# Patient Record
Sex: Male | Born: 1950 | Race: White | Hispanic: No | Smoking: Never smoker
Health system: Southern US, Community
[De-identification: ages and names within clinical notes are randomized; demographics above are authoritative.]

## PROBLEM LIST (undated history)

## (undated) DIAGNOSIS — E559 Vitamin D deficiency, unspecified: Secondary | ICD-10-CM

## (undated) DIAGNOSIS — F32A Depression, unspecified: Secondary | ICD-10-CM

## (undated) DIAGNOSIS — F419 Anxiety disorder, unspecified: Secondary | ICD-10-CM

## (undated) DIAGNOSIS — F329 Major depressive disorder, single episode, unspecified: Secondary | ICD-10-CM

## (undated) DIAGNOSIS — E785 Hyperlipidemia, unspecified: Secondary | ICD-10-CM

## (undated) DIAGNOSIS — F909 Attention-deficit hyperactivity disorder, unspecified type: Secondary | ICD-10-CM

## (undated) HISTORY — DX: Attention-deficit hyperactivity disorder, unspecified type: F90.9

## (undated) HISTORY — DX: Anxiety disorder, unspecified: F41.9

## (undated) HISTORY — DX: Depression, unspecified: F32.A

## (undated) HISTORY — DX: Vitamin D deficiency, unspecified: E55.9

## (undated) HISTORY — DX: Hyperlipidemia, unspecified: E78.5

---
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
## 2002-07-13 ENCOUNTER — Ambulatory Visit (HOSPITAL_COMMUNITY): Admission: RE | Admit: 2002-07-13 | Discharge: 2002-07-13 | Payer: Self-pay | Admitting: Gastroenterology

## 2002-07-13 ENCOUNTER — Encounter (INDEPENDENT_AMBULATORY_CARE_PROVIDER_SITE_OTHER): Payer: Self-pay | Admitting: Specialist

## 2005-01-08 ENCOUNTER — Ambulatory Visit: Payer: Self-pay | Admitting: Cardiology

## 2005-02-19 ENCOUNTER — Encounter: Payer: Self-pay | Admitting: Cardiology

## 2005-02-19 ENCOUNTER — Ambulatory Visit: Payer: Self-pay | Admitting: Cardiology

## 2005-02-19 ENCOUNTER — Ambulatory Visit (HOSPITAL_COMMUNITY): Admission: RE | Admit: 2005-02-19 | Discharge: 2005-02-19 | Payer: Self-pay | Admitting: Cardiology

## 2005-04-09 ENCOUNTER — Ambulatory Visit: Payer: Self-pay

## 2005-07-18 HISTORY — PX: MITRAL VALVE REPAIR: SHX2039

## 2005-08-05 ENCOUNTER — Ambulatory Visit: Payer: Self-pay

## 2005-08-05 ENCOUNTER — Encounter: Payer: Self-pay | Admitting: Internal Medicine

## 2005-08-05 ENCOUNTER — Ambulatory Visit: Payer: Self-pay | Admitting: Cardiology

## 2006-10-14 ENCOUNTER — Ambulatory Visit: Payer: Self-pay | Admitting: Cardiology

## 2006-10-14 ENCOUNTER — Ambulatory Visit: Payer: Self-pay

## 2006-10-14 ENCOUNTER — Encounter: Payer: Self-pay | Admitting: Cardiology

## 2006-11-08 ENCOUNTER — Ambulatory Visit: Payer: Self-pay | Admitting: Internal Medicine

## 2006-11-08 ENCOUNTER — Ambulatory Visit (HOSPITAL_COMMUNITY): Admission: RE | Admit: 2006-11-08 | Discharge: 2006-11-08 | Payer: Self-pay | Admitting: Cardiology

## 2008-10-14 ENCOUNTER — Ambulatory Visit: Payer: Self-pay | Admitting: Internal Medicine

## 2008-10-14 ENCOUNTER — Encounter: Payer: Self-pay | Admitting: Internal Medicine

## 2008-10-16 ENCOUNTER — Ambulatory Visit: Payer: Self-pay | Admitting: Cardiology

## 2008-10-16 ENCOUNTER — Inpatient Hospital Stay (HOSPITAL_BASED_OUTPATIENT_CLINIC_OR_DEPARTMENT_OTHER): Admission: RE | Admit: 2008-10-16 | Discharge: 2008-10-16 | Payer: Self-pay | Admitting: Cardiology

## 2009-07-16 ENCOUNTER — Ambulatory Visit: Payer: Self-pay | Admitting: Sports Medicine

## 2009-07-16 DIAGNOSIS — M19049 Primary osteoarthritis, unspecified hand: Secondary | ICD-10-CM | POA: Insufficient documentation

## 2009-07-16 DIAGNOSIS — M25539 Pain in unspecified wrist: Secondary | ICD-10-CM | POA: Insufficient documentation

## 2010-12-15 LAB — POCT I-STAT 3, VENOUS BLOOD GAS (G3P V)
Acid-base deficit: 4 mmol/L — ABNORMAL HIGH (ref 0.0–2.0)
Bicarbonate: 22.6 mEq/L (ref 20.0–24.0)
pO2, Ven: 40 mmHg (ref 30.0–45.0)

## 2010-12-15 LAB — POCT I-STAT 3, ART BLOOD GAS (G3+)
O2 Saturation: 95 %
TCO2: 23 mmol/L (ref 0–100)
pCO2 arterial: 43.2 mmHg (ref 35.0–45.0)

## 2011-01-12 NOTE — Cardiovascular Report (Signed)
Justin Marshall, Justin Marshall                ACCOUNT NO.:  [id number]   MEDICAL RECORD NO.:  [id number]          PATIENT TYPE:  OIB   LOCATION:  1961                         FACILITY:  [facility]   PHYSICIAN:  Justin R. Juanda Chance, MD, FACCDATE OF BIRTH:  03/19/1951   DATE OF PROCEDURE:  10/16/2008  DATE OF DISCHARGE:                            CARDIAC CATHETERIZATION   CLINICAL HISTORY:  Justin Marshall is 60 year old.  In 2006, Justin Marshall had mitral  valve repair for severe mitral regurgitation by Dr. Diona Fanti at [facility].  Justin Marshall has done well since that time, although Justin Marshall says Justin Marshall has  never really gotten his exercise tolerance back.  More recently, Justin Marshall has  had increased shortness of breath with exertion.  Justin Marshall saw Dr. Dietrich Pates  in the office and she did an echocardiogram which indicated that the  valve function was good without any significant regurgitation and with  good left ventricular function.  She recommended further evaluation and  angiography for evaluation of his shortness of breath.   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  Justin Marshall has 4 daughters, but they  are all away from home, either in school or working.  Justin Marshall has been  separated from his wife since 2006.   PROCEDURE:  The procedure was performed via the right femoral artery and  arterial sheath and 4-French preformed coronary catheters.  A front wall  arterial puncture was performed and Omnipaque contrast was used.  Right  heart catheterization was performed percutaneously via right femoral  vein using venous sheath and Swan-Ganz thermodilution catheter.  The  patient tolerated the procedure well and left Laboratory in satisfactory  condition.   RESULTS:  Left main coronary artery.  The left main coronary artery was  free of significant disease.   Left anterior descending artery.  The left anterior  descending artery  gave rise 2 septal perforators and 3 diagonal branches.  There was  minimal narrowing in the proximal LAD of 20%.   Circumflex artery.  The circumflex artery gave rise to a large ramus  branch,  a large marginal branch, and 2 small posterolateral branches.  These vessels were free of significant disease.   Right coronary artery.  The right coronary artery was a moderate-sized  vessel that gave rise to a conus branch, 2 right ventricular branches, a  posterior descending branch, a posterolateral branch.  This vessel was  free of significant disease.   Left ventriculogram.  The left ventriculogram performed on the RAO  projection showed good wall motion with no areas of hypokinesis.  There  was no mitral regurgitation.  Estimated ejection fraction was 50-55%.   HEMODYNAMIC DATA:  The right atrial pressure was 5 mean.  The pulmonary  artery pressure was 19/8 with a mean of 13.  The pulmonary wedge  pressure was 10 mean.  Left ventricular pressure was 113/17.  The aortic  pressure was 113/69 with mean of 90.  Cardiac output/cardiac index  was  4.7/2.6 meters/minutes/meter squared by Fick.   CONCLUSION:  1. Minimal nonobstructive coronary artery disease with 20% narrowing      in the proximal LAD.  2. Good left ventricular function.  3. No significant mitral regurgitation and no mitral valve gradient.   RECOMMENDATIONS:  The patient has no source of ischemia and his valve  function looks good and his pulmonary artery pressures and filling  pressures are normal.  I think it is not likely his shortness breath is  cardiac in etiology.  Justin Marshall has been under a great deal of stress recently  and I will discuss with him regarding further management and further  help with this.      Justin Elvera Lennox Juanda Chance, MD, [facility]  Electronically Signed     BRB/MEDQ  D:  10/16/2008  T:  10/17/2008  Job:  [id number]   cc:   Arturo Morton. Riley Kill, MD, [facility]  Luis Abed, MD, [facility]  Pricilla Riffle,  MD, [facility]

## 2011-01-12 NOTE — Assessment & Plan Note (Signed)
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   HISTORY OF PRESENT ILLNESS:  The patient has a history of mitral valve  prolapse/mitral regurgitation, is status post mitral valve repair at  [facility] back in 2006.  At that time, he reportedly had a 20% left main  lesion.   The patient was seen last in Cardiology Clinic by Jerral Bonito.  Evaluation  included an echocardiogram that showed normal LV function, normal valve  function, mitral valve function.  He underwent cardiopulmonary stress  testing also at that time for shortness of breath that actually showed  normal tolerance.   The patient called in earlier this morning.  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  Still he notes with increased stress, he still cannot  explain all his shortness of breath.  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  He had attributed it to deconditioning and poor conditioning  in the past, but he is concerned now about the excessive recovery time.  His secretary at work also added that with stairs, he has to sit down to  catch his breath (one flight).   The patient notes rare chest pain.  He attributed this spell he had  recently to excess [facility].  He denies PND.  Appetite is good.  No  lower extremity edema.   ALLERGIES:  Flecainide leading to  ventricular tachycardia.   CURRENT MEDICATIONS:  1. Aspirin 325 one time per week.  2. Adderall 20 mg t.i.d. (prescribed by Dr. Marina Goodell).  3. Lexapro 10 or 20 mg daily.  4. Vitamin D 50,000 international units one time per week.  5. Coenzyme Q10.  6. Multivitamin.   PAST MEDICAL HISTORY:  Mitral valve prolapse status post repair,  palpitations in 1988.   PAST SURGICAL HISTORY:  Status post mitral valve repair.   FAMILY HISTORY:  Mother with a history of cardiomyopathy, normal  coronary arteries.  Two siblings with bipolar disorder.  The patient has  four daughters.   REVIEW OF SYSTEMS:  The patient notes his total cholesterol was up over  200.  He is going to recheck it.  Otherwise, all systems reviewed.  He  does note he is cold all the time.  He did check his thyroid which he  reports is normal.  Also notes his vitamin D level has been low at  14.  Drinks a lot of coffee, [employer].  Again says, his appetite is good.   PHYSICAL EXAMINATION:  GENERAL:  The patient is a 60 year old who looks  mildly disheveled.  Conversation a little scattered.  VITAL SIGNS:  Blood pressure 130/70, pulse is 90s to 108, weight 144  down from 152 in 2008.  HEENT:  Normocephalic, atraumatic.  EOMI.  PERRL.  The patient wears  glasses.  NECK:  JVP is normal.  No thyromegaly.  No bruits audible.  LUNGS:  Clear.  Moving air well.  No wheezes or rales.  CARDIAC:  Regular rate and rhythm.  S1 and S2.  No S3, no murmurs.  PMI  not displaced.  ABDOMEN:  Supple.  Scaphoid.  No masses.  No hepatomegaly.  Normal bowel  sounds.  EXTREMITIES:  Good distal pulses.  No lower extremity edema.   A 12-lead EKG shows normal sinus rhythm to sinus tachycardia 98-108  beats per minute.  There is slight sagging of the ST segments in the  inferior leads II, III, and lateral leads.  V5, V6 are not significantly  changed from previous.   Echocardiogram today shows normal LV and RV systolic function.  Mitral  valve  moves well.  There is trivial MR.  Tricuspid regurg is trivial,  difficult to estimate PA pressure.   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  He comes in  today for evaluation of dyspnea.  I had a hard time with his history  getting a consistent exercise tolerance, but I am convinced he is more  short of breath than he should be for his age.  Whether this is all  because of the stress he is under or if there is a cardiac reason I am  not sure.  I do think it would be important to rule out any progression  of his coronary artery disease before simply labeling it as stress.  Therefore, I have discussed this with Bonnee Quin that the patient  undergo cardiac catheterization to define his anatomy.  We can also  measure pulmonary pressures at that time.  If this is negative, I have  spent time talking to the patient he needs to modify his working  condition to allow him to get more controlled time.   Over the next couple of days, he should not work as much as he has been  take his time doing things.  He understands this and agrees to cut back  some.  He will get his labs at his office tomorrow and we will be in  touch with him regarding the time for Wednesday.     Pricilla Riffle, MD, [facility]  Electronically Signed    PVR/MedQ  DD: 10/14/2008  DT: 10/15/2008  Job #: [id number]

## 2011-01-15 NOTE — Op Note (Signed)
   NAME:  [name], Justin Marshall                          ACCOUNT NO.:  [id number]   MEDICAL RECORD NO.:  [id number]                   PATIENT TYPE:  AMB   LOCATION:  ENDO                                 [facility]ACILITY[facility]  [facility]   PHYSICIAN:  Bernette Redbird, M.D.                DATE OF BIRTH:  05/09/1951   DATE OF PROCEDURE:  07/13/2002  DATE OF DISCHARGE:                                 OPERATIVE REPORT   PROCEDURE:  Upper endoscopy with biopsies.   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   FINDINGS:  Normal exam.   DESCRIPTION OF PROCEDURE:  The nature, purpose and risk of the procedure had  been discussed with the patient who provided written consent. The Olympus  video endoscope was passed under direct vision. The vocal cords were not  well seen. The esophagus was entered without significant difficulty and had  normal mucosa without evidence of reflux esophagitis, Barrett's esophagus,  varices, infection or neoplasia. No ring, stricture or significant hiatal  hernia was appreciated.   The stomach contained no significant residual and had normal mucosa without  evidence of gastritis, erosions, ulcers, polyps or masses and the pylorus,  duodenal bulb and second duodenum looked normal including the villous  mucosal pattern. Multiple duodenal biopsies were obtained to rule out celiac  disease prior to removal of the scope. The patient tolerated the procedure  well and there were no apparent complications.   IMPRESSION:  Essentially normal endoscopy. Slightly irregular  Z line but no  definite evidence of Barrett's esophagus identified.   PLAN:  Await pathology on duodenal biopsies.                                               Bernette Redbird, M.D.    RB/MEDQ  D:  07/13/2002  T:  07/13/2002  Job:  [id number]   cc:   Mila Homer. Sudie Bailey, M.D.  [address] [place], [place] [address]  Fax: [phone]

## 2011-01-15 NOTE — Op Note (Signed)
   NAME:  Justin Marshall, Justin Marshall                          ACCOUNT NO.:  [id number]   MEDICAL RECORD NO.:  [id number]                   PATIENT TYPE:  AMB   LOCATION:  ENDO                                 FACILITY:  [facility]   PHYSICIAN:  Bernette Redbird, M.D.                DATE OF BIRTH:  1951-04-09   DATE OF PROCEDURE:  07/13/2002  DATE OF DISCHARGE:                                 OPERATIVE REPORT   PROCEDURE:  Colonoscopy.   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   FINDINGS:  Normal exam to the terminal ileum.   DESCRIPTION OF PROCEDURE:  The nature, purpose and risk of the procedure had  been discussed with the patient who provided written consent. Digital exam  of the prostate was normal. Sedation for this procedure and the upper  endoscopy which preceded it totaled fentanyl 100 mcg and Versed 12 mg IV  without arrhythmias or desaturation.   The Olympus adjustable tension pediatric video colonoscope was advanced  around a somewhat angulated rectosigmoid junction and then quite easily  around the colon to the terminal ileum which had a normal appearance. It was  examined for about 10 or 20 cm and pullback was then performed.   This was a normal examination. The quality of the prep was excellent and it  is felt that all areas were well seen. No polyps, cancer, colitis, vascular  malformations or diverticulosis were noted. Retroflexion in the rectum was  unremarkable. No biopsies were obtained during this examination, which the  patient tolerated well, and without apparent complications.   IMPRESSION:  Normal screening colonoscopy.   PLAN:  Consider screening colonoscopy again in 5-10 years.                                               Bernette Redbird, M.D.    RB/MEDQ  D:  07/13/2002  T:  07/13/2002  Job:  [id number]   cc:   Mila Homer. Sudie Bailey, M.D.  [address] [place], [place] [address]  Fax: [phone]

## 2011-01-15 NOTE — Assessment & Plan Note (Signed)
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  Dr.  Riley Kill is his primary cardiologist.  The patient had a mitral valve  repair done at [facility].  This was done in 2006.  His cath was done at [facility]  on July 19, 2005.  That study did show a normal ejection fraction.  The study was read as showing no significant coronary disease except for  a long left main with a distal 25% lesion in it.  It is possible that  this area is related only to the way that the left main bifurcates into  essentially 4 vessels very rapidly.   The patient has seen Dr. Willette Cluster back and also he has seen Dr. Riley Kill.  His last office visit was August 05, 2005.  He was anemic.  His echo  showed good LV function.  There was question of very slight wall motion  abnormalities, but this was not a major problem.   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  He does not have chest pain.  He has had some mild dizziness  but no syncope.  He also mentions that he is under significant stress at  work.  His nurse practitioner was going to be away in a short period of  time and he, therefore, hoped that he could be seen here rapidly, and  therefore I have seen him on my schedule.  I had a long discussion with  him.   PAST MEDICAL HISTORY:  ALLERGIES:  THERE IS QUESTION THAT THE PATIENT  RECEIVED FLECAINIDE AROUND THE TIME OF HIS HEART SURGERY AND THERE WAS  VENTRICULAR TACHYCARDIA.   MEDICATIONS:  The patient occasionally takes an aspirin.  He  occasionally has taken some Lexapro over time.  He occasionally uses  Protonix and he takes Adderall sometimes.   OTHER MEDICAL PROBLEMS:  See the complete list below.   REVIEW OF SYSTEMS:  He has some slight dizziness.  He is not having any  headaches.  He has no fevers.  He is having exertional shortness of  breath as described.  He is not having any major GI or GU symptoms.  Otherwise, the review of systems is negative.   PHYSICAL EXAM:  Patient is oriented to person, time and place.  His  affect reveals that he is anxious about his situation and anxious about  his office.  He knows that this is a problem for him.  There is no  xanthelasma.  There is normal extraocular motion.  There are no carotid  bruits.  There is no jugular venous distention.  LUNGS:  Clear.  Respiratory effort is not labored.  CARDIAC:  Reveals an S1 with an S2.  There are  no clicks or significant  murmurs.  ABDOMEN:  Soft.  There are no masses or bruits.  He has no significant  peripheral edema.  He has 2+ distal pulses.   The patient says that his hemoglobin that he checked recently was  normal.  His LDL is up a little.  His TSH was normal.  He had a chest  x-ray that he also felt showed no significant abnormality.   Patient had a 2D echo today.  I read it and compared it with prior  studies.  He has good LV function.  There is slight dyssynchrony of the  septum.  The ejection fraction is in the 55-60% range.  His mitral valve  orifice area is in the range of 2.7 cm2.  There is no mitral  regurgitation.  Overall, there is no diagnostic abnormality.   PROBLEMS:  1. Intermittent use of his medications as described above.  2. Some elevation of his liver function studies as documented in prior      laboratories in the past.  3. Platelet count of 605,000 in the past.  4. Question of some narrowing of the distal left main.  Historically,      I believe that it has been felt that his coronaries reveal no      significant abnormalities.  The report suggests that there may be      an unusual shape related to four vessels  bifurcating.  5. Status post mitral valve repair in December of 2006.  6. Shortness of breath with only modest exercise, etiology is unclear.      At this point, the exact etiology of his shortness of breath is not      clear to me.  In considering all issues, I felt that a      cardiopulmonary exercise test would be helpful.  Dr. Gala Romney was      available and actually spoke with Dr. Purnell Shoemaker also.  We will be      proceeding with a cardiopulmonary exercise test.  If there appears      to be a significant circulatory abnormality, consideration could be      given to right and left heart catheterization.  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  I did encourage him to be      active.     Luis Abed, MD, [facility]  Electronically Signed    JDK/MedQ  DD: 10/14/2006  DT: 10/14/2006  Job #: [id number]   cc:   Arturo Morton. Riley Kill, MD, [facility]  Bevelyn Buckles. Bensimhon, MD  Mila Homer. Sudie Bailey, M.D.

## 2016-11-24 DIAGNOSIS — H26493 Other secondary cataract, bilateral: Secondary | ICD-10-CM | POA: Diagnosis not present

## 2016-11-24 DIAGNOSIS — Z961 Presence of intraocular lens: Secondary | ICD-10-CM | POA: Diagnosis not present

## 2016-12-22 DIAGNOSIS — H26492 Other secondary cataract, left eye: Secondary | ICD-10-CM | POA: Diagnosis not present

## 2016-12-29 DIAGNOSIS — F419 Anxiety disorder, unspecified: Secondary | ICD-10-CM | POA: Diagnosis not present

## 2016-12-29 DIAGNOSIS — F900 Attention-deficit hyperactivity disorder, predominantly inattentive type: Secondary | ICD-10-CM | POA: Diagnosis not present

## 2017-01-13 DIAGNOSIS — F419 Anxiety disorder, unspecified: Secondary | ICD-10-CM | POA: Diagnosis not present

## 2017-01-19 DIAGNOSIS — F419 Anxiety disorder, unspecified: Secondary | ICD-10-CM | POA: Diagnosis not present

## 2017-02-07 DIAGNOSIS — F419 Anxiety disorder, unspecified: Secondary | ICD-10-CM | POA: Diagnosis not present

## 2017-02-14 DIAGNOSIS — F419 Anxiety disorder, unspecified: Secondary | ICD-10-CM | POA: Diagnosis not present

## 2017-03-10 DIAGNOSIS — F419 Anxiety disorder, unspecified: Secondary | ICD-10-CM | POA: Diagnosis not present

## 2017-03-25 DIAGNOSIS — F419 Anxiety disorder, unspecified: Secondary | ICD-10-CM | POA: Diagnosis not present

## 2017-04-06 DIAGNOSIS — F419 Anxiety disorder, unspecified: Secondary | ICD-10-CM | POA: Diagnosis not present

## 2017-04-27 DIAGNOSIS — F419 Anxiety disorder, unspecified: Secondary | ICD-10-CM | POA: Diagnosis not present

## 2017-05-11 DIAGNOSIS — F419 Anxiety disorder, unspecified: Secondary | ICD-10-CM | POA: Diagnosis not present

## 2017-05-25 DIAGNOSIS — F419 Anxiety disorder, unspecified: Secondary | ICD-10-CM | POA: Diagnosis not present

## 2017-06-08 DIAGNOSIS — F419 Anxiety disorder, unspecified: Secondary | ICD-10-CM | POA: Diagnosis not present

## 2017-07-05 DIAGNOSIS — F419 Anxiety disorder, unspecified: Secondary | ICD-10-CM | POA: Diagnosis not present

## 2017-07-13 DIAGNOSIS — R9431 Abnormal electrocardiogram [ECG] [EKG]: Secondary | ICD-10-CM | POA: Diagnosis not present

## 2017-07-13 DIAGNOSIS — F419 Anxiety disorder, unspecified: Secondary | ICD-10-CM | POA: Diagnosis not present

## 2017-07-13 DIAGNOSIS — R0609 Other forms of dyspnea: Secondary | ICD-10-CM | POA: Diagnosis not present

## 2017-07-13 DIAGNOSIS — Z9889 Other specified postprocedural states: Secondary | ICD-10-CM | POA: Diagnosis not present

## 2017-07-13 DIAGNOSIS — Z0189 Encounter for other specified special examinations: Secondary | ICD-10-CM | POA: Diagnosis not present

## 2017-08-03 DIAGNOSIS — F419 Anxiety disorder, unspecified: Secondary | ICD-10-CM | POA: Diagnosis not present

## 2017-08-31 DIAGNOSIS — D649 Anemia, unspecified: Secondary | ICD-10-CM | POA: Diagnosis not present

## 2017-08-31 DIAGNOSIS — F419 Anxiety disorder, unspecified: Secondary | ICD-10-CM | POA: Diagnosis not present

## 2017-09-08 DIAGNOSIS — F419 Anxiety disorder, unspecified: Secondary | ICD-10-CM | POA: Diagnosis not present

## 2017-09-12 DIAGNOSIS — R0602 Shortness of breath: Secondary | ICD-10-CM | POA: Diagnosis not present

## 2017-09-12 DIAGNOSIS — F419 Anxiety disorder, unspecified: Secondary | ICD-10-CM | POA: Diagnosis not present

## 2017-09-12 DIAGNOSIS — Z9889 Other specified postprocedural states: Secondary | ICD-10-CM | POA: Diagnosis not present

## 2017-09-21 DIAGNOSIS — Z1211 Encounter for screening for malignant neoplasm of colon: Secondary | ICD-10-CM | POA: Diagnosis not present

## 2017-09-21 DIAGNOSIS — D509 Iron deficiency anemia, unspecified: Secondary | ICD-10-CM | POA: Diagnosis not present

## 2017-09-27 DIAGNOSIS — D509 Iron deficiency anemia, unspecified: Secondary | ICD-10-CM | POA: Diagnosis not present

## 2017-10-19 DIAGNOSIS — F419 Anxiety disorder, unspecified: Secondary | ICD-10-CM | POA: Diagnosis not present

## 2017-11-30 DIAGNOSIS — F419 Anxiety disorder, unspecified: Secondary | ICD-10-CM | POA: Diagnosis not present

## 2017-12-13 DIAGNOSIS — F419 Anxiety disorder, unspecified: Secondary | ICD-10-CM | POA: Diagnosis not present

## 2018-03-07 DIAGNOSIS — F419 Anxiety disorder, unspecified: Secondary | ICD-10-CM | POA: Diagnosis not present

## 2018-03-08 DIAGNOSIS — F419 Anxiety disorder, unspecified: Secondary | ICD-10-CM | POA: Diagnosis not present

## 2018-04-19 DIAGNOSIS — F419 Anxiety disorder, unspecified: Secondary | ICD-10-CM | POA: Diagnosis not present

## 2018-05-02 DIAGNOSIS — Z961 Presence of intraocular lens: Secondary | ICD-10-CM | POA: Diagnosis not present

## 2018-05-02 DIAGNOSIS — H43393 Other vitreous opacities, bilateral: Secondary | ICD-10-CM | POA: Diagnosis not present

## 2018-05-02 DIAGNOSIS — H04123 Dry eye syndrome of bilateral lacrimal glands: Secondary | ICD-10-CM | POA: Diagnosis not present

## 2018-05-23 DIAGNOSIS — F1520 Other stimulant dependence, uncomplicated: Secondary | ICD-10-CM

## 2018-05-23 DIAGNOSIS — F411 Generalized anxiety disorder: Secondary | ICD-10-CM

## 2018-06-07 ENCOUNTER — Ambulatory Visit (INDEPENDENT_AMBULATORY_CARE_PROVIDER_SITE_OTHER): Payer: Medicare Other | Admitting: Psychiatry

## 2018-06-07 DIAGNOSIS — F331 Major depressive disorder, recurrent, moderate: Secondary | ICD-10-CM

## 2018-06-07 DIAGNOSIS — F411 Generalized anxiety disorder: Secondary | ICD-10-CM | POA: Diagnosis not present

## 2018-06-07 DIAGNOSIS — F902 Attention-deficit hyperactivity disorder, combined type: Secondary | ICD-10-CM | POA: Diagnosis not present

## 2018-06-07 MED ORDER — AMPHETAMINE-DEXTROAMPHETAMINE 30 MG PO TABS: 15.0000 mg | ORAL_TABLET | Freq: Three times a day (TID) | ORAL | 0 refills | Status: DC

## 2018-06-07 MED ORDER — LISDEXAMFETAMINE DIMESYLATE 70 MG PO CAPS: 70.0000 mg | ORAL_CAPSULE | ORAL | 0 refills | Status: DC

## 2018-06-07 NOTE — Progress Notes (Signed)
[facility]  Patient ID: Justin Marshall,  MRN: [id number]  PCP: Patient, No Pcp Per3  Date of Evaluation: 06/07/2018 Time spent:30 minutes   HISTORY/CURRENT STATUS: HPI  WJ:XBJYN chronically late and disorganized and less productive than he needs to be.  Missed flight dt being late.Partial benefit from meds at max doses.  Still hasn't done taxes due soon.  Mood variable. Affected by ADD.  Not despondent.  Frustrated. Lonely.  He t hinks he gets some benefit from the lamotrigine and also some possible sleep benefit from the lamotrigine.sleep can be affected by his erratic schedule   Prior psych meds trials include Strattera which caused a tremor, bupropion, and modafinil.  Also a history of Lexapro with questionable effect.  Remote history of Ritalin use.  History of valproic acid which cause a tremor.  There has been a question in the past of possible bipolar disorder  Individual Medical History/ Review of Systems: Changes? :Yes consistency px with meds sometimes. Vitamin D level 41 recently.  Allergies: Patient has no known allergies.  Current Medications:  Current Outpatient Medications:  .  amphetamine-dextroamphetamine (ADDERALL) 30 MG tablet, Take 30 mg by mouth 3 (three) times daily. 1/2 TID, Disp: , Rfl:  .  donepezil (ARICEPT) 10 MG tablet, Take 10 mg by mouth daily., Disp: , Rfl:  .  lamoTRIgine (LAMICTAL) 150 MG tablet, Take 150 mg by mouth daily., Disp: , Rfl:  .  lisdexamfetamine (VYVANSE) 70 MG capsule, Take 70 mg by mouth every morning., Disp: , Rfl:  .  lithium carbonate 300 MG capsule, Take 300 mg by mouth at bedtime., Disp: , Rfl:  .  Vitamin D, Ergocalciferol, (DRISDOL) 50000 units CAPS capsule, Take 50,000 Units by mouth daily., Disp: , Rfl:  Medication Side Effects: None  Family Medical/ Social History: Changes? Yes Death of relatives recently.  Work stresses.  Denies SUD.  MENTAL HEALTH EXAM:  There were no vitals taken for this visit.There is no  height or weight on file to calculate BMI.  General Appearance: Neat  Eye Contact:  Good  Speech:  Normal Rate talkative  Volume:  Normal  Mood:  Dysphoric and frustrated, anxiety situational  Affect:  Appropriate  Thought Process:  Coherent  Orientation:  Full (Time, Place, and Person)  Thought Content: WDL   Suicidal Thoughts:  No  Homicidal Thoughts:  No  Memory:  Recent  Judgement:  Fair  Insight:  Fair  Psychomotor Activity:  Normal  Concentration:  Concentration: Fair  Recall:  Good  Fund of Knowledge: Good  Language: Good  Akathisia:  No  AIMS (if indicated): not done  Assets:  Desire for Improvement Financial Resources/Insurance Housing Physical Health Resilience Transportation Vocational/Educational  ADL's:  Intact  Cognition: WNL  Prognosis:  Fair    DIAGNOSES:    ICD-10-CM   1. Attention deficit hyperactivity disorder (ADHD), combined type F90.2   2. Generalized anxiety disorder F41.1   3. 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  We have maximized his ADD medicines as can be best determined at this time.  He has some mild depression which appears mostly situational so we will not attempt further medicine changes.  There is concern with those changes might have cognitive side effects.  Have discussed this case with Dr. Farrel Demark and we concur that there is insufficient evidence for bipolar disorder. Disc work stress and loneliness, ADD and performance strategies..Prioritize tasks.  Disc off-label use donepezil.  He has not noted significant benefit at this point.  We might consider Namenda as an alternative at the next visit.  Disc lithium use for cognitive protection and perhaps mild mood benefit  Disc cost of Vyvanse at $150/month and possible alternatives.  Disc past dx of Adderall overuse that was noted in the chart.  Always got it from doctors.   Consistency of only Adderall would be a problem for consistent control of his ADD.  There is no evidence for abuse of Adderall.  Cont meds.  Follow-up 3 months  Lauraine Rinne, MD

## 2018-06-23 ENCOUNTER — Ambulatory Visit (INDEPENDENT_AMBULATORY_CARE_PROVIDER_SITE_OTHER): Payer: Medicare Other | Admitting: Psychiatry

## 2018-06-23 DIAGNOSIS — F411 Generalized anxiety disorder: Secondary | ICD-10-CM

## 2018-06-23 DIAGNOSIS — F423 Hoarding disorder: Secondary | ICD-10-CM | POA: Diagnosis not present

## 2018-06-23 DIAGNOSIS — F902 Attention-deficit hyperactivity disorder, combined type: Secondary | ICD-10-CM | POA: Diagnosis not present

## 2018-06-23 DIAGNOSIS — F3181 Bipolar II disorder: Secondary | ICD-10-CM
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
## 2018-07-11 ENCOUNTER — Other Ambulatory Visit: Payer: Self-pay | Admitting: Psychiatry

## 2018-07-11 DIAGNOSIS — F902 Attention-deficit hyperactivity disorder, combined type: Secondary | ICD-10-CM

## 2018-07-11 MED ORDER — AMPHETAMINE-DEXTROAMPHETAMINE 30 MG PO TABS: 15.0000 mg | ORAL_TABLET | Freq: Three times a day (TID) | ORAL | 0 refills | Status: DC

## 2018-07-11 MED ORDER — LISDEXAMFETAMINE DIMESYLATE 70 MG PO CAPS: 70.0000 mg | ORAL_CAPSULE | Freq: Every day | ORAL | 0 refills | Status: DC

## 2018-07-11 MED ORDER — AMPHETAMINE-DEXTROAMPHETAMINE 15 MG PO TABS: 15.0000 mg | ORAL_TABLET | Freq: Three times a day (TID) | ORAL | 0 refills | Status: DC

## 2018-07-11 MED ORDER — LISDEXAMFETAMINE DIMESYLATE 70 MG PO CAPS: 70.0000 mg | ORAL_CAPSULE | ORAL | 0 refills | Status: DC

## 2018-07-11 NOTE — Progress Notes (Signed)
2 refills vyvanse and adderall

## 2018-07-20 ENCOUNTER — Ambulatory Visit (INDEPENDENT_AMBULATORY_CARE_PROVIDER_SITE_OTHER): Payer: Medicare Other | Admitting: Psychiatry

## 2018-07-20 DIAGNOSIS — F423 Hoarding disorder: Secondary | ICD-10-CM | POA: Diagnosis not present

## 2018-07-20 DIAGNOSIS — F411 Generalized anxiety disorder: Secondary | ICD-10-CM | POA: Diagnosis not present

## 2018-07-20 DIAGNOSIS — F3181 Bipolar II disorder: Secondary | ICD-10-CM

## 2018-07-20 DIAGNOSIS — F902 Attention-deficit hyperactivity disorder, combined type: Secondary | ICD-10-CM | POA: Diagnosis not present
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
## 2018-08-11 DIAGNOSIS — Z23 Encounter for immunization: Secondary | ICD-10-CM | POA: Diagnosis not present

## 2018-08-14 DIAGNOSIS — Z23 Encounter for immunization: Secondary | ICD-10-CM | POA: Diagnosis not present

## 2018-09-04 ENCOUNTER — Telehealth: Payer: Self-pay | Admitting: Psychiatry

## 2018-09-04 ENCOUNTER — Other Ambulatory Visit: Payer: Self-pay | Admitting: Psychiatry

## 2018-09-04 ENCOUNTER — Ambulatory Visit (INDEPENDENT_AMBULATORY_CARE_PROVIDER_SITE_OTHER): Payer: Medicare HMO | Admitting: Psychiatry

## 2018-09-04 DIAGNOSIS — F423 Hoarding disorder: Secondary | ICD-10-CM | POA: Diagnosis not present

## 2018-09-04 DIAGNOSIS — F3181 Bipolar II disorder: Secondary | ICD-10-CM | POA: Diagnosis not present

## 2018-09-04 DIAGNOSIS — F902 Attention-deficit hyperactivity disorder, combined type: Secondary | ICD-10-CM

## 2018-09-04 DIAGNOSIS — F411 Generalized anxiety disorder: Secondary | ICD-10-CM

## 2018-09-04 DIAGNOSIS — R69 Illness, unspecified: Secondary | ICD-10-CM | POA: Diagnosis not present

## 2018-09-04 MED ORDER — AMPHETAMINE-DEXTROAMPHETAMINE 30 MG PO TABS: 15.0000 mg | ORAL_TABLET | Freq: Three times a day (TID) | ORAL | 0 refills | Status: DC

## 2018-09-04 MED ORDER — LISDEXAMFETAMINE DIMESYLATE 70 MG PO CAPS: 70.0000 mg | ORAL_CAPSULE | Freq: Every day | ORAL | 0 refills | Status: DC

## 2018-09-04 MED ORDER — LISDEXAMFETAMINE DIMESYLATE 70 MG PO CAPS: 70.0000 mg | ORAL_CAPSULE | ORAL | 0 refills | Status: DC

## 2018-09-04 NOTE — Progress Notes (Signed)
3 refills of Vyvanse and Adderall.

## 2018-09-04 NOTE — Telephone Encounter (Signed)
Patient reported that he needs refills on Vyvanse and Adderall.  [facility] [place]  Appt. 09/13/18

## 2018-09-04 NOTE — Progress Notes (Signed)
Psychotherapy Progress Note -- Marliss Czar, PhD, [facility]  Patient ID: Justin Marshall     MRN: [id number]     Date: 09/04/2018     Therapy format: Individual psychotherapy Start: 1:13p Stop: 2:30p Time Spent: 77 min (charge 45' courtesy) Accompanied by: none  Session narrative -- interim history, self-report of stressors and symptoms, applications of prior therapy, status changes, and interventions in session Had daughter and granddaughter in, was able to ride with ex-wife comfortably.  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  Elease Hashimoto remains in group home last he knows, she cut off communication.  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  DEA says they emailed him in October, but not seen by him.  Turns out he had answered "no" to whether he had ever voluntarily surrendered license with cause, not sure about shades of meaning in the question and wanting to avoid having to explain complex history, and the discrepancy blocked reinstatement.  Customer service process put him with a DEA agent, awaiting contact now to complete whatever protocol is required.    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  Given the possibility of him pulling out, PT would like to establish 1 more day/week in the clinic to continue serving and working with the reliable, largely Hispanic clientele and staff he has.  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  Can do laundry there, more easily than at home, plus has three feral cats he hosts there.  Cont quandary whether to sell it.  Has been approached by town of [place] to rent it, considering.  Has been able to do projects at home more since coming off traveling work.  Slow going, lots of stops and starts and distractions, and not necessarily highest priority tasks done first, but more productive.  Has looked arouns and figured he would not finish everything if he lives to 88, feels himself tiring.  Got annoyed with [name], who came over for a day of help cleaning up and wound up questioning a lot of his choices/priorities.  Got one of his overdue tax returns finished and turned in.  Notes some morning sluggishness.  Recommended use timers he has to put lights on for wake time. Tends to beat self up emotionally in the morning for things not done.    Therapeutic modalities: Cognitive Behavioral Therapy and Solution-Oriented/Positive Psychology  Mental Status/Observations:  Appearance:   Casual and Neat     Behavior:  Appropriate and Sharing  Motor:  Normal  Speech/Language:   Clear and Coherent  Affect:  Appropriate  Mood:  dysthymic  Thought process:  meandering some  Thought content:    WNL  Sensory/Perceptual disturbances:    WNL  Orientation:  WNL  Attention:  Good  Concentration:  Fair  Memory:  WNL  Insight:    Good  Judgment:   Fair  Impulse Control:  Fair   Risk Assessment: Danger to Self:  No Self-injurious Behavior: No Danger to Others: No Duty to Warn:no Physical Aggression / Violence:No  Access to Firearms a concern: No   Diagnosis:   ICD-10-CM   1. Attention deficit hyperactivity disorder (ADHD), combined type F90.2   2. 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  Bipolar 2 disorder, major depressive episode (HCC) F31.81   4. Generalized anxiety disorder F41.1     Assessment of progress:  stable  Plan:  . Keep working through clutter and procrastinated tasks as able . For mornings, seek better light, maybe programmed to come on  . For energy, use deep breathing and light exposure prn . Consider which large-scale move to make if it looks necessary -- sell clinic, arrange a mass move of children's furniture, call junk removal service, or officially decide to put the burden on his survivors . Continue to utilize previously learned skills ad lib . Maintain medication, if prescribed, and work faithfully with relevant prescriber(s) . Call the clinic on-call service, present to ER, or call 911 if any life-threatening emergency . Follow up with me in about 2-8 weeks as available  Robley Fries, PhD [place] Licensed Psychologist

## 2018-09-13 ENCOUNTER — Encounter: Payer: Self-pay | Admitting: Psychiatry

## 2018-09-13 ENCOUNTER — Ambulatory Visit (INDEPENDENT_AMBULATORY_CARE_PROVIDER_SITE_OTHER): Payer: Medicare HMO | Admitting: Psychiatry

## 2018-09-13 VITALS — BP 150/78 | HR 78

## 2018-09-13 DIAGNOSIS — F902 Attention-deficit hyperactivity disorder, combined type: Secondary | ICD-10-CM | POA: Diagnosis not present

## 2018-09-13 DIAGNOSIS — F411 Generalized anxiety disorder: Secondary | ICD-10-CM

## 2018-09-13 DIAGNOSIS — R69 Illness, unspecified: Secondary | ICD-10-CM | POA: Diagnosis not present

## 2018-09-13 MED ORDER — LITHIUM CARBONATE 300 MG PO CAPS: 300.0000 mg | ORAL_CAPSULE | Freq: Every day | ORAL | 3 refills | Status: DC

## 2018-09-13 NOTE — Progress Notes (Signed)
Justin Marshall [id number] 1950-12-30 67 y.o.  Subjective:   Patient ID:  Justin Marshall is a 68 y.o. (DOB [birth year]) male.  Chief Complaint:  Chief Complaint  Patient presents with  . Follow-up    Medication Management    HPI last seen June 07, 2018 Justin Marshall presents to the office today for follow-up of severe ADD and mood symptoms.  Overall thinks he's making progress but not sure.  Ex GF committed suicide.  Still hurts.  Happened Saturday.  Disc licensing issues.  Issue that he felt was unfair.  More time off is okay in a way.  Is appealing it.  Patient reports stable mood and denies depressed or irritable moods.  Patient denies any recent difficulty with anxiety.  Patient denies difficulty with sleep initiation or maintenance. Denies appetite disturbance.  Patient reports that energy and motivation have been good.  Patient denies any difficulty with concentration.  Patient denies any suicidal ideation.  Exercise again.  Forgets Aricept a lot.  Still trouble with focus and attention but less demand at the moment bc not working at the moment.  Disc cost concerns with Vyvanse.  Prior psych meds trials include Strattera which caused a tremor, bupropion, and modafinil.  Also a history of Lexapro with questionable effect.  Remote history of Ritalin use.  History of valproic acid which cause a tremor.  There has been a question in the past of possible bipolar disorder  Review of Systems:  Review of Systems  Neurological: Negative for tremors and weakness.  Psychiatric/Behavioral: Negative for agitation, behavioral problems, confusion, decreased concentration, dysphoric mood, hallucinations, self-injury, sleep disturbance and suicidal ideas. The patient is not nervous/anxious and is not hyperactive.     Medications: I have reviewed the patient's current medications.  Current Outpatient Medications  Medication Sig Dispense Refill  . amphetamine-dextroamphetamine (ADDERALL)  30 MG tablet Take 0.5 tablets by mouth 3 (three) times daily. 45 tablet 0  . [START ON 10/02/2018] amphetamine-dextroamphetamine (ADDERALL) 30 MG tablet Take 0.5 tablets by mouth 3 (three) times daily. 45 tablet 0  . [START ON 10/30/2018] amphetamine-dextroamphetamine (ADDERALL) 30 MG tablet Take 0.5 tablets by mouth 3 (three) times daily. 45 tablet 0  . donepezil (ARICEPT) 10 MG tablet Take 10 mg by mouth daily.    Marland Kitchen lamoTRIgine (LAMICTAL) 150 MG tablet Take 150 mg by mouth daily.    Marland Kitchen lisdexamfetamine (VYVANSE) 70 MG capsule Take 1 capsule (70 mg total) by mouth every morning. 30 capsule 0  . [START ON 10/02/2018] lisdexamfetamine (VYVANSE) 70 MG capsule Take 1 capsule (70 mg total) by mouth daily. 30 capsule 0  . [START ON 10/30/2018] lisdexamfetamine (VYVANSE) 70 MG capsule Take 1 capsule (70 mg total) by mouth daily. 30 capsule 0  . Vitamin D, Ergocalciferol, (DRISDOL) 50000 units CAPS capsule Take 50,000 Units by mouth daily.    Marland Kitchen lithium carbonate 300 MG capsule Take 300 mg by mouth at bedtime.     No current facility-administered medications for this visit.     Medication Side Effects: None  Allergies: No Known Allergies  No past medical history on file.  No family history on file.  Social History   Socioeconomic History  . Marital status: Legally Separated    Spouse name: Not on file  . Number of children: Not on file  . Years of education: Not on file  . Highest education level: Not on file  Occupational History  . Not on file  Social Needs  . Physicist, medical  strain: Not on file  . Food insecurity:    Worry: Not on file    Inability: Not on file  . Transportation needs:    Medical: Not on file    Non-medical: Not on file  Tobacco Use  . Smoking status: Never Smoker  . Smokeless tobacco: Never Used  Substance and Sexual Activity  . Alcohol use: Not Currently  . Drug use: Never  . Sexual activity: Not on file  Lifestyle  . Physical activity:    Days per week: Not  on file    Minutes per session: Not on file  . Stress: Not on file  Relationships  . Social connections:    Talks on phone: Not on file    Gets together: Not on file    Attends religious service: Not on file    Active member of club or organization: Not on file    Attends meetings of clubs or organizations: Not on file    Relationship status: Not on file  . Intimate partner violence:    Fear of current or ex partner: Not on file    Emotionally abused: Not on file    Physically abused: Not on file    Forced sexual activity: Not on file  Other Topics Concern  . Not on file  Social History Narrative  . Not on file    Past Medical History, Surgical history, Social history, and Family history were reviewed and updated as appropriate.   Please see review of systems for further details on the patient's review from today.   Objective:   Physical Exam:  BP (!) 150/78 (BP Location: Left Arm)   Pulse 78   Physical Exam Constitutional:      General: He is not in acute distress.    Appearance: He is well-developed.  Musculoskeletal:        General: No deformity.  Neurological:     Mental Status: He is alert and oriented to person, place, and time.     Motor: No tremor.     Coordination: Coordination normal.     Gait: Gait normal.  Psychiatric:        Attention and Perception: Perception normal. He is inattentive.        Mood and Affect: Mood is not anxious or depressed. Affect is not labile, blunt, angry or inappropriate.        Speech: Speech normal.        Behavior: Behavior normal.        Thought Content: Thought content normal. Thought content does not include homicidal or suicidal ideation. Thought content does not include homicidal or suicidal plan.        Cognition and Memory: Cognition normal.        Judgment: Judgment normal.     Comments: Insight intact. No auditory or visual hallucinations. No delusions.      Lab Review:  No results found for: NA, K, CL, CO2,  GLUCOSE, BUN, CREATININE, CALCIUM, PROT, ALBUMIN, AST, ALT, ALKPHOS, BILITOT, GFRNONAA, GFRAA  No results found for: WBC, RBC, HGB, HCT, PLT, MCV, MCH, MCHC, RDW, LYMPHSABS, MONOABS, EOSABS, BASOSABS  No results found for: POCLITH, LITHIUM   No results found for: PHENYTOIN, PHENOBARB, VALPROATE, CBMZ   .res Assessment: Plan:    Attention deficit hyperactivity disorder (ADHD), combined type  Generalized anxiety disorder   He remains chronically scattered disorganized and often late.  We have maximized his ADD medicines as can be best determined at this time.  He has  some mild depression which appears mostly situational so we will not attempt further medicine changes. There is concern with those changes might have cognitive side effects.  Have discussed this case with Dr. Farrel Demark and we concur that there is insufficient evidence for bipolar disorder. Disc work stress and loneliness, ADD and performance strategies..Prioritize tasks.  Supportive therapy and grief work.  Doesn't like being alone.  DC Aricept bc no apparent benefit.  Call if he misses it.  Option alternatives with Vyvanse DT cost concerns.  Use Adderall  XR  If necessary.  FU 4 mo  Meredith Staggers, MD, DFAPA   Please see After Visit Summary for patient specific instructions.  No future appointments.  No orders of the defined types were placed in this encounter.     -------------------------------Past Psychiatric Trials include: Modafinil, Strattera and Wellbutrin

## 2018-09-15 ENCOUNTER — Encounter: Payer: Self-pay | Admitting: Psychiatry

## 2018-10-03 ENCOUNTER — Encounter: Payer: Self-pay | Admitting: Emergency Medicine

## 2018-10-03 DIAGNOSIS — F988 Other specified behavioral and emotional disorders with onset usually occurring in childhood and adolescence: Secondary | ICD-10-CM

## 2018-10-06 DIAGNOSIS — Z0289 Encounter for other administrative examinations: Secondary | ICD-10-CM

## 2018-10-09 ENCOUNTER — Telehealth: Payer: Self-pay | Admitting: Psychiatry

## 2018-10-09 NOTE — Telephone Encounter (Signed)
Pt notifying you that his Vyvanse will cost  Over $300. He wants you to Rx another med. [facility]

## 2018-10-10 NOTE — Telephone Encounter (Signed)
Left voicemail with Justin Marshall to call back with information

## 2018-10-10 NOTE — Telephone Encounter (Signed)
There is no cost-effective long-acting alternative to the Vyvanse.  Through the combination of Vyvanse and Adderall 15 mg 3 times daily he is taking a lot of amphetamine stimulant.  The most cost effective response to this situation is to switch to all short acting Adderall and try to space it through the day to get adequate duration and to smooth it out.  I could increase the Adderall to 30 mg twice a day and then 15 mg at the last or third dose if he would like to do that.  That should be fairly inexpensive

## 2018-10-10 NOTE — Telephone Encounter (Signed)
See me about this. I spoke to [place].

## 2018-10-11 NOTE — Telephone Encounter (Signed)
He agrees to the changes, he picked up 30 mg #45 on 02/10. Instructed to adjust his dosing and his updated rx will be submitted, insurance probably won't allow pick up till he's running low. Verbalized understanding.   [facility] [address]

## 2018-10-18 ENCOUNTER — Telehealth: Payer: Self-pay | Admitting: Psychiatry

## 2018-10-18 ENCOUNTER — Other Ambulatory Visit: Payer: Self-pay | Admitting: Psychiatry

## 2018-10-18 DIAGNOSIS — F902 Attention-deficit hyperactivity disorder, combined type: Secondary | ICD-10-CM

## 2018-10-18 MED ORDER — AMPHETAMINE-DEXTROAMPHETAMINE 30 MG PO TABS: 30.0000 mg | ORAL_TABLET | Freq: Three times a day (TID) | ORAL | 0 refills | Status: DC

## 2018-10-18 NOTE — Progress Notes (Signed)
Change stimulants because of cost concerns with Vyvanse.  Increased Adderall to 30 mg 3 times daily.  There may be insurance issues with that.  It is a high dose but he has severe ADD and it is warranted and tolerated.  Meredith Staggers, MD, DFAPA

## 2018-10-18 NOTE — Telephone Encounter (Signed)
Justin Marshall called and indicated that the cost of Vyvanse is high.  He was told that a supplemental Adderall would be called in to go with the Adderall he already has, but that has not been done.  Please advise.  Next appt 5/23

## 2018-10-25 ENCOUNTER — Ambulatory Visit: Payer: Medicare HMO | Admitting: Psychiatry

## 2018-10-25 ENCOUNTER — Ambulatory Visit (INDEPENDENT_AMBULATORY_CARE_PROVIDER_SITE_OTHER): Payer: Medicare HMO | Admitting: Psychiatry

## 2018-10-25 DIAGNOSIS — F902 Attention-deficit hyperactivity disorder, combined type: Secondary | ICD-10-CM | POA: Diagnosis not present

## 2018-10-25 DIAGNOSIS — F411 Generalized anxiety disorder: Secondary | ICD-10-CM

## 2018-10-25 DIAGNOSIS — F423 Hoarding disorder: Secondary | ICD-10-CM

## 2018-10-25 DIAGNOSIS — F3181 Bipolar II disorder: Secondary | ICD-10-CM | POA: Diagnosis not present

## 2018-10-25 DIAGNOSIS — R69 Illness, unspecified: Secondary | ICD-10-CM | POA: Diagnosis not present

## 2018-10-25 NOTE — Progress Notes (Signed)
Psychotherapy Progress Note [facility] Justin Czar, PhD LP  Patient ID: Justin Marshall     MRN: [id number]     Therapy format: Individual psychotherapy Date: 10/25/2018     Start: 4:23p Stop: 5:13p Time Spent: 50 min  Session narrative -- presenting needs, interim history, self-report of stressors and symptoms, applications of prior therapy, status changes, and interventions made in session Ran into issues with cost of Vyvanse, handled last two weeks with Dr. Jennelle Marshall.  Last seen for [facility] 09/04/18.  Been traveling a good bit to see family.  Back in town a week now from [place] to see daughter Justin Marshall (Location manager, headed back to [place] for graduation then back to [facility]).    Appt today rescheduled due to [place] need, PT ran late for being "on a roll" with maintenance tasks he wanted to see himself get to at home.  Justin Marshall has a suggestion for therapy -- a workbook on mastering Adult ADHD.  Discussed, briefly evaluated, encouraged it would be worthwhile as a tool for PT and a focal point for ADHD therapy, provided family and other stresses do not   Ex-lover Justin Marshall committed suicide mid-Jan., while PT in [place], news received by text from her sister while having fun with his sister and her kids.  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  She worked for him years ago, relationship struck up only well after separated and after she was no longer working for him.  Support provided.  In some conflict about ongoing relationship with Justin Marshall, who annoys him but longstanding sexual relationship.  Realizes he may be holding himself back from better, truer relationship with a future by continuing to allow and service this one.  Discussed possibility of seeing others, using his dating site membership, with the understanding that it could be nervewracking to keep "interviewing".  Therapeutic modalities: Cognitive Behavioral Therapy,  Solution-Oriented/Positive Psychology and Ego-Supportive  Mental Status/Observations:  Appearance:   basically neat, mildly unkempt in places  Behavior:  Appropriate  Motor:  Normal  Speech/Language:   Clear and Coherent  Affect:  Appropriate  Mood:  euthymic and except re. loss  Thought process:  tangential  Thought content:    WNL  Sensory/Perceptual disturbances:    WNL  Orientation:  WNL  Attention:  Fair; almost forgot items  Concentration:  Fair  Memory:  grossly intact  Insight:    Good  Judgment:   Fair  Impulse Control:  Fair   Risk Assessment: Danger to Self:  No Self-injurious Behavior: No Danger to Others: No Duty to Warn:no Physical Aggression / Violence:No  Access to Firearms a concern: No   Diagnosis:   ICD-10-CM   1. Attention deficit hyperactivity disorder (ADHD), combined type F90.2   2. Generalized anxiety disorder F41.1   3. 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 Bipolar 2 disorder, major depressive episode (HCC) F31.81     Assessment of progress:  stable  Plan:  . Resolved to look into Adult ADD workbook, willing to work together more programmatically using it . Resolved to do 1-minute journaling during a 3-minute commercial break during Jeopardy -- just make a note about the day, expressive only.  Example 1 high, 1 low. . Continue to utilize previously learned skills ad lib . Maintain medication as prescribed and work faithfully with relevant prescriber(s) if any changes are desired or seem indicated . Call the clinic  on-call service, present to ER, or call 911 if any life-threatening emergency Return in about 3 weeks (around 11/15/2018).   Justin Fries, PhD [place] Licensed Psychologist

## 2018-11-15 ENCOUNTER — Encounter: Payer: Self-pay | Admitting: Family

## 2018-11-15 ENCOUNTER — Ambulatory Visit (INDEPENDENT_AMBULATORY_CARE_PROVIDER_SITE_OTHER): Payer: Medicare HMO | Admitting: Family

## 2018-11-15 ENCOUNTER — Other Ambulatory Visit: Payer: Self-pay

## 2018-11-15 ENCOUNTER — Ambulatory Visit: Payer: Self-pay | Admitting: Family

## 2018-11-15 ENCOUNTER — Ambulatory Visit: Payer: Medicare HMO | Admitting: Psychiatry

## 2018-11-15 ENCOUNTER — Ambulatory Visit (HOSPITAL_COMMUNITY)
Admission: RE | Admit: 2018-11-15 | Discharge: 2018-11-15 | Disposition: A | Discharge status: Home / Self Care | Payer: Medicare HMO | Source: Ambulatory Visit | Attending: Family | Admitting: Family

## 2018-11-15 VITALS — BP 118/64 | HR 99 | Temp 98.2°F | Ht 68.0 in | Wt 154.0 lb

## 2018-11-15 DIAGNOSIS — R6889 Other general symptoms and signs: Secondary | ICD-10-CM

## 2018-11-15 DIAGNOSIS — R509 Fever, unspecified: Secondary | ICD-10-CM

## 2018-11-15 DIAGNOSIS — R05 Cough: Secondary | ICD-10-CM | POA: Insufficient documentation

## 2018-11-15 DIAGNOSIS — R059 Cough, unspecified: Secondary | ICD-10-CM

## 2018-11-15 DIAGNOSIS — Z20828 Contact with and (suspected) exposure to other viral communicable diseases: Secondary | ICD-10-CM

## 2018-11-15 DIAGNOSIS — R739 Hyperglycemia, unspecified: Secondary | ICD-10-CM | POA: Diagnosis not present

## 2018-11-15 LAB — POCT INFLUENZA A/B
Influenza A, POC: NEGATIVE
Influenza B, POC: NEGATIVE

## 2018-11-15 NOTE — Progress Notes (Signed)
Provider: Marlowe Sax FNP-C  Patient, No Pcp Per  Patient Care Team: Patient, No Pcp Per as PCP - General (General Practice)  Extended Emergency Contact Information Primary Emergency Contact: [place] Phone: [phone] Relation: None  Goals of care: Advanced Directive information No flowsheet data found.   Chief Complaint  Patient presents with  . Acute Visit    Patient states he has fever 105 started the night before last patient states he has had flu shot mild shortness of breath     HPI:  Pt is a 68 y.o. 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   History reviewed. No pertinent past medical history. History reviewed. No pertinent surgical history.  No Known Allergies  Outpatient Encounter Medications as of 11/15/2018  Medication Sig  . amphetamine-dextroamphetamine (ADDERALL) 30 MG tablet Take 0.5 tablets by mouth 3 (three) times daily.  Marland Kitchen lamoTRIgine (LAMICTAL) 150 MG tablet Take 150 mg by mouth daily.  Marland Kitchen lisdexamfetamine (VYVANSE) 70 MG capsule Take 1 capsule (70 mg total) by mouth every morning.  . lithium carbonate 300 MG capsule Take 1 capsule (300 mg total) by mouth at bedtime.  . Vitamin D, Ergocalciferol, (DRISDOL) 50000 units CAPS capsule Take 50,000 Units by mouth daily.  . [DISCONTINUED] amphetamine-dextroamphetamine (ADDERALL) 30 MG tablet Take 0.5 tablets by mouth 3 (three) times daily.   . [DISCONTINUED] amphetamine-dextroamphetamine (ADDERALL) 30 MG tablet Take 1 tablet by mouth 3 (three) times daily.  . [DISCONTINUED] donepezil (ARICEPT) 10 MG tablet Take 10 mg by mouth daily.  . [DISCONTINUED] lisdexamfetamine (VYVANSE) 70 MG capsule Take 1 capsule (70 mg total) by mouth daily.  . [DISCONTINUED] lisdexamfetamine (VYVANSE) 70 MG capsule Take 1 capsule (70 mg total) by mouth daily.   No facility-administered encounter medications on file as of 11/15/2018.     Review of Systems  Constitutional: Negative for unexpected weight change.       Fever and chills per HPI  HENT: Positive for rhinorrhea. Negative for postnasal drip, sinus pressure, sinus pain, sneezing, sore throat, trouble swallowing and voice change.   Eyes: Negative for pain, discharge, redness and itching.       Wears eye glasses   Respiratory: Positive for cough and shortness of breath. Negative for chest tightness and wheezing.        Pain on back with cough and deep breathing.   Cardiovascular: Negative for chest pain, palpitations and leg swelling.  Gastrointestinal: Negative for abdominal distention, abdominal pain, constipation, diarrhea, nausea and vomiting.  Musculoskeletal: Negative for arthralgias and myalgias.  Skin: Negative for color change, pallor and rash.  Neurological: Negative for dizziness, weakness, light-headedness and headaches.    Immunization History  Administered Date(s) Administered  . Influenza, High Dose Seasonal PF 08/03/2018  . Pneumococcal Conjugate-13 08/03/2018  . Zoster Recombinat (Shingrix) 10/31/2018   Pertinent  Health Maintenance Due  Topic Date Due  . COLONOSCOPY  09/22/2001  . PNA vac Low Risk Adult (1 of 2 - PCV13) 09/22/2016  .  INFLUENZA VACCINE  03/30/2018   Fall Risk  11/15/2018  Falls in the past year? 0  Number falls in past yr: 0  Injury with Fall? 0      Vitals:   11/15/18 1153  BP: 118/64  Pulse: 99  Temp: 98.2 F (36.8 C)  TempSrc: Oral   SpO2: 93%  Weight: 154 lb (69.9 kg)  Height: '5\' 8"'$  (1.727 m)   Body mass index is 23.42 kg/m. Physical Exam Vitals signs reviewed.  Constitutional:      General: He is not in acute distress.    Appearance: He is normal weight. He is not ill-appearing.  HENT:     Head: Normocephalic.     Right Ear: Tympanic membrane normal. There is impacted cerumen.     Left Ear: Tympanic membrane, ear canal and external ear normal. There is no impacted cerumen.     Nose: Rhinorrhea present. No congestion.     Mouth/Throat:     Mouth: Mucous membranes are moist.     Pharynx: Oropharynx is clear. No oropharyngeal exudate or posterior oropharyngeal erythema.  Eyes:     General: No scleral icterus.       Right eye: No discharge.        Left eye: No discharge.     Conjunctiva/sclera: Conjunctivae normal.     Pupils: Pupils are equal, round, and reactive to light.  Neck:     Musculoskeletal: Normal range of motion. No neck rigidity or muscular tenderness.  Cardiovascular:     Rate and Rhythm: Regular rhythm.     Pulses: Normal pulses.     Heart sounds: Normal heart sounds. No murmur. No friction rub. No gallop.      Comments: HR 99 b/min  Pulmonary:     Effort: Pulmonary effort is normal. No respiratory distress.     Breath sounds: No wheezing or rhonchi.     Comments: Bilateral bases diminished breath sounds and right mid lobe rales cleared with coughing.  Chest:     Chest wall: No tenderness.  Abdominal:     General: Bowel sounds are normal. There is no distension.     Palpations: Abdomen is soft. There is no mass.     Tenderness: There is no abdominal tenderness. There is no right CVA tenderness, left CVA tenderness, guarding or rebound.  Musculoskeletal: Normal range of motion.        General: No swelling or tenderness.     Right lower leg: No edema.     Left lower leg: No edema.  Lymphadenopathy:     Cervical: No cervical adenopathy.  Skin:    General: Skin is warm and dry.      Coloration: Skin is not pale.     Findings: No erythema or rash.  Neurological:     Mental Status: He is alert and oriented to person, place, and time.     Cranial Nerves: No cranial nerve deficit.     Sensory: No sensory deficit.     Motor: No weakness.     Coordination: Coordination normal.     Gait: Gait normal.  Psychiatric:        Mood and Affect: Mood normal.        Behavior: Behavior normal.        Thought Content: Thought content normal.        Judgment: Judgment normal.    Labs reviewed: No results for input(s): NA, K, CL, CO2, GLUCOSE, BUN, CREATININE, CALCIUM, MG, PHOS in the last  8760 hours. No results for input(s): AST, ALT, ALKPHOS, BILITOT, PROT, ALBUMIN in the last 8760 hours. No results for input(s): WBC, NEUTROABS, HGB, HCT, MCV, PLT in the last 8760 hours. No results found for: TSH No results found for: HGBA1C No results found for: CHOL, HDL, LDLCALC, LDLDIRECT, TRIG, CHOLHDL  Significant Diagnostic Results in last 30 days:  No results found.  Assessment/Plan  1. Cough/shortness of breath Reported Temp 105 afebrile during visit. Bilateral lung bases diminished with right upper rales cleared with cough.  - CBC with Differential/Platelet - BMP with eGFR([facility]) - DG Chest 2 View; Future - POC Influenza A/B  2. Fever and chills Temp 105 last night but none this visit.chills this morning. - CBC with Differential/Platelet - POC Influenza A/B - encouraged to increase fluid intake  3. Flu-like symptoms Temp 105 last night but none this visit.  - POC Influenza A/B - Influenza A and B Ag, Immunoassay Influenza test was negative during visit.   4. SARS-associated coronavirus exposure High risk for exposure medical Doctor recently seen several patient's with upper respiratory symptoms. - Coronavirus CoVID-19 ([facility]) -Encouraged to follow COVID -19 infection prevention per CDC guidelines. - Quarantine 3 days until CoVID -19 results received.   5. Cerumen  impaction  Right TM not visualized due to cerumen impaction.Will need ear lavage once feeling better from current cough/Flu like symptoms.    Family/ staff Communication: Reviewed plan of care with patient   Labs/tests ordered:   - CBC with Differential/Platelet - BMP with eGFR([facility]) - DG Chest 2 View; Future  - - POC Influenza A/B  Nelda Bucks , NP

## 2018-11-15 NOTE — Patient Instructions (Signed)
Person Under Monitoring Name: Justin Marshall  Location: [address] [place] [address]   Infection Prevention Recommendations for Individuals Confirmed to have, or Being Evaluated for, 2019 Novel Coronavirus (COVID-19) Infection Who Receive Care at Home  Individuals who are confirmed to have, or are being evaluated for, COVID-19 should follow the prevention steps below until a healthcare provider or local or state health department says they can return to normal activities.  Stay home except to get medical care You should restrict activities outside your home, except for getting medical care. Do not go to work, school, or public areas, and do not use public transportation or taxis.  Call ahead before visiting your doctor Before your medical appointment, call the healthcare provider and tell them that you have, or are being evaluated for, COVID-19 infection. This will help the healthcare provider's office take steps to keep other people from getting infected. Ask your healthcare provider to call the local or state health department.  Monitor your symptoms Seek prompt medical attention if your illness is worsening (e.g., difficulty breathing). Before going to your medical appointment, call the healthcare provider and tell them that you have, or are being evaluated for, COVID-19 infection. Ask your healthcare provider to call the local or state health department.  Wear a facemask You should wear a facemask that covers your nose and mouth when you are in the same room with other people and when you visit a healthcare provider. People who live with or visit you should also wear a facemask while they are in the same room with you.  Separate yourself from other people in your home As much as possible, you should stay in a different room from other people in your home. Also, you should use a separate bathroom, if available.  Avoid sharing household items You should not share  dishes, drinking glasses, cups, eating utensils, towels, bedding, or other items with other people in your home. After using these items, you should wash them thoroughly with soap and water.  Cover your coughs and sneezes Cover your mouth and nose with a tissue when you cough or sneeze, or you can cough or sneeze into your sleeve. Throw used tissues in a lined trash can, and immediately wash your hands with soap and water for at least 20 seconds or use an alcohol-based hand rub.  Wash your [employer] your hands often and thoroughly with soap and water for at least 20 seconds. You can use an alcohol-based hand sanitizer if soap and water are not available and if your hands are not visibly dirty. Avoid touching your eyes, nose, and mouth with unwashed hands.   Prevention Steps for Caregivers and Household Members of Individuals Confirmed to have, or Being Evaluated for, COVID-19 Infection Being Cared for in the Home  If you live with, or provide care at home for, a person confirmed to have, or being evaluated for, COVID-19 infection please follow these guidelines to prevent infection:  Follow healthcare provider's instructions Make sure that you understand and can help the patient follow any healthcare provider instructions for all care.  Provide for the patient's basic needs You should help the patient with basic needs in the home and provide support for getting groceries, prescriptions, and other personal needs.  Monitor the patient's symptoms If they are getting sicker, call his or her medical provider and tell them that the patient has, or is being evaluated for, COVID-19 infection. This will help the healthcare provider's  office take steps to keep other people from getting infected. Ask the healthcare provider to call the local or state health department.  Limit the number of people who have contact with the patient  If possible, have only one caregiver for the patient.  Other  household members should stay in another home or place of residence. If this is not possible, they should stay  in another room, or be separated from the patient as much as possible. Use a separate bathroom, if available.  Restrict visitors who do not have an essential need to be in the home.  Keep older adults, very young children, and other sick people away from the patient Keep older adults, very young children, and those who have compromised immune systems or chronic health conditions away from the patient. This includes people with chronic heart, lung, or kidney conditions, diabetes, and cancer.  Ensure good ventilation Make sure that shared spaces in the home have good air flow, such as from an air conditioner or an opened window, weather permitting.  Wash your hands often  Wash your hands often and thoroughly with soap and water for at least 20 seconds. You can use an alcohol based hand sanitizer if soap and water are not available and if your hands are not visibly dirty.  Avoid touching your eyes, nose, and mouth with unwashed hands.  Use disposable paper towels to dry your hands. If not available, use dedicated cloth towels and replace them when they become wet.  Wear a facemask and gloves  Wear a disposable facemask at all times in the room and gloves when you touch or have contact with the patient's blood, body fluids, and/or secretions or excretions, such as sweat, saliva, sputum, nasal mucus, vomit, urine, or feces.  Ensure the mask fits over your nose and mouth tightly, and do not touch it during use.  Throw out disposable facemasks and gloves after using them. Do not reuse.  Wash your hands immediately after removing your facemask and gloves.  If your personal clothing becomes contaminated, carefully remove clothing and launder. Wash your hands after handling contaminated clothing.  Place all used disposable facemasks, gloves, and other waste in a lined container before  disposing them with other household waste.  Remove gloves and wash your hands immediately after handling these items.  Do not share dishes, glasses, or other household items with the patient  Avoid sharing household items. You should not share dishes, drinking glasses, cups, eating utensils, towels, bedding, or other items with a patient who is confirmed to have, or being evaluated for, COVID-19 infection.  After the person uses these items, you should wash them thoroughly with soap and water.  Wash laundry thoroughly  Immediately remove and wash clothes or bedding that have blood, body fluids, and/or secretions or excretions, such as sweat, saliva, sputum, nasal mucus, vomit, urine, or feces, on them.  Wear gloves when handling laundry from the patient.  Read and follow directions on labels of laundry or clothing items and detergent. In general, wash and dry with the warmest temperatures recommended on the label.  Clean all areas the individual has used often  Clean all touchable surfaces, such as counters, tabletops, doorknobs, bathroom fixtures, toilets, phones, keyboards, tablets, and bedside tables, every day. Also, clean any surfaces that may have blood, body fluids, and/or secretions or excretions on them.  Wear gloves when cleaning surfaces the patient has come in contact with.  Use a diluted bleach solution (e.g., dilute bleach with 1  part bleach and 10 parts water) or a household disinfectant with a label that says EPA-registered for coronaviruses. To make a bleach solution at home, add 1 tablespoon of bleach to 1 quart (4 cups) of water. For a larger supply, add  cup of bleach to 1 gallon (16 cups) of water.  Read labels of cleaning products and follow recommendations provided on product labels. Labels contain instructions for safe and effective use of the cleaning product including precautions you should take when applying the product, such as wearing gloves or eye protection  and making sure you have good ventilation during use of the product.  Remove gloves and wash hands immediately after cleaning.  Monitor yourself for signs and symptoms of illness Caregivers and household members are considered close contacts, should monitor their health, and will be asked to limit movement outside of the home to the extent possible. Follow the monitoring steps for close contacts listed on the symptom monitoring form.   ? If you have additional questions, contact your local health department or call the epidemiologist on call at [phone] (available 24/7). ? This guidance is subject to change. For the most up-to-date guidance from [facility], please refer to their website: YouBlogs.pl

## 2018-11-16 NOTE — Addendum Note (Signed)
Addended by: Phineas Semen A on: 11/16/2018 10:13 AM   Modules accepted: Orders

## 2018-11-18 LAB — BASIC METABOLIC PANEL WITH GFR
BUN: 22 mg/dL (ref 7–25)
CO2: 26 mmol/L (ref 20–32)
CREATININE: 1.1 mg/dL (ref 0.70–1.25)
Calcium: 9.4 mg/dL (ref 8.6–10.3)
Chloride: 104 mmol/L (ref 98–110)
GFR, EST NON AFRICAN AMERICAN: 69 mL/min/{1.73_m2} (ref >=60)
GFR, Est African American: 80 mL/min/{1.73_m2} (ref >=60)
Glucose, Bld: 117 mg/dL — ABNORMAL HIGH (ref 65–99)
Potassium: 4.2 mmol/L (ref 3.5–5.3)
Sodium: 139 mmol/L (ref 135–146)

## 2018-11-18 LAB — HEMOGLOBIN A1C
Hgb A1c MFr Bld: 5.7 % of total Hgb — ABNORMAL HIGH (ref <5.7)
Mean Plasma Glucose: 117 (calc)
eAG (mmol/L): 6.5 (calc)

## 2018-11-18 LAB — CBC WITH DIFFERENTIAL/PLATELET
Absolute Monocytes: 969 cells/uL — ABNORMAL HIGH (ref 200–950)
Basophils Absolute: 30 cells/uL (ref 0–200)
Basophils Relative: 0.4 %
Eosinophils Absolute: 104 cells/uL (ref 15–500)
Eosinophils Relative: 1.4 %
HCT: 39.4 % (ref 38.5–50.0)
Hemoglobin: 13 g/dL — ABNORMAL LOW (ref 13.2–17.1)
Lymphs Abs: 1029 cells/uL (ref 850–3900)
MCH: 30.9 pg (ref 27.0–33.0)
MCHC: 33 g/dL (ref 32.0–36.0)
MCV: 93.6 fL (ref 80.0–100.0)
MPV: 10.2 fL (ref 7.5–12.5)
Monocytes Relative: 13.1 %
NEUTROS PCT: 71.2 %
Neutro Abs: 5269 cells/uL (ref 1500–7800)
Platelets: 287 10*3/uL (ref 140–400)
RBC: 4.21 10*6/uL (ref 4.20–5.80)
RDW: 13.2 % (ref 11.0–15.0)
Total Lymphocyte: 13.9 %
WBC: 7.4 10*3/uL (ref 3.8–10.8)

## 2018-11-18 LAB — TEST AUTHORIZATION

## 2018-11-21 ENCOUNTER — Telehealth: Payer: Self-pay

## 2018-11-21 NOTE — Telephone Encounter (Signed)
Please send letter that patient may return to work COVID- 19 test results were negative.

## 2018-11-21 NOTE — Telephone Encounter (Signed)
Patient daughter requested note for patient to return to work. Patient was recently tested for COVID-19 and test results came back negative. Patient would like clearance to return to work. Please advise.

## 2018-11-22 NOTE — Telephone Encounter (Signed)
Composed letter for patient. Patient's daughter stated will  will deliver to patient.

## 2018-11-25 LAB — SARS CORONAVIRUS WITH COV-2 RNA, QUAL REAL TIME RT-PCR
Overall Result:: NOT DETECTED
Pan-SARS RNA: NEGATIVE
SARS Cov2 RNA: NEGATIVE

## 2018-12-18 ENCOUNTER — Other Ambulatory Visit: Payer: Self-pay

## 2018-12-18 ENCOUNTER — Other Ambulatory Visit: Payer: Self-pay | Admitting: Psychiatry

## 2018-12-18 ENCOUNTER — Telehealth: Payer: Self-pay | Admitting: Psychiatry

## 2018-12-18 DIAGNOSIS — F902 Attention-deficit hyperactivity disorder, combined type: Secondary | ICD-10-CM

## 2018-12-18 MED ORDER — AMPHETAMINE-DEXTROAMPHETAMINE 30 MG PO TABS: 30.0000 mg | ORAL_TABLET | Freq: Three times a day (TID) | ORAL | 0 refills | Status: DC

## 2018-12-18 NOTE — Telephone Encounter (Signed)
He was previously on Vyvanse 70+ Adderall 15 3 times daily.  However with his new insurance plan the Vyvanse is unaffordable so we have increased the Adderall to 30 mg 3 times daily for his severe ADHD and stopped the Vyvanse.  It is okay to give 3 prescriptions for this

## 2018-12-18 NOTE — Telephone Encounter (Signed)
Pt requests RF of Adderall 30mg  tab- taking 3 per day.  Per the last instructions ( but the last RX doesn't say this).  To [facility] in [place], [place]

## 2018-12-18 NOTE — Progress Notes (Signed)
Patient has severe ADD.  Vyvanse has become unaffordable with his new insurance.  I have therefore increased Adderall to greater than usual 30 mg 3 times daily.

## 2018-12-19 ENCOUNTER — Other Ambulatory Visit: Payer: Self-pay

## 2018-12-19 ENCOUNTER — Ambulatory Visit (INDEPENDENT_AMBULATORY_CARE_PROVIDER_SITE_OTHER): Payer: Medicare HMO | Admitting: Psychiatry

## 2018-12-19 DIAGNOSIS — F411 Generalized anxiety disorder: Secondary | ICD-10-CM | POA: Diagnosis not present

## 2018-12-19 DIAGNOSIS — Z62820 Parent-biological child conflict: Secondary | ICD-10-CM

## 2018-12-19 DIAGNOSIS — F902 Attention-deficit hyperactivity disorder, combined type: Secondary | ICD-10-CM

## 2018-12-19 DIAGNOSIS — F331 Major depressive disorder, recurrent, moderate: Secondary | ICD-10-CM | POA: Diagnosis not present

## 2018-12-19 DIAGNOSIS — F423 Hoarding disorder: Secondary | ICD-10-CM | POA: Diagnosis not present

## 2018-12-19 DIAGNOSIS — R69 Illness, unspecified: Secondary | ICD-10-CM | POA: Diagnosis not present

## 2018-12-19 NOTE — Progress Notes (Signed)
Psychotherapy Progress Note [facility] Marliss Czar, PhD LP  Patient ID: Justin Marshall     MRN: [id number]     Therapy format: Family therapy w/ patient -- accompanied by daughter, Justin Marshall Date: 12/19/2018     Start: 4:05p Stop: 5:03p Time Spent: 58 min  Telehealth visit I connected with patient by a video enabled telemedicine/telehealth application or telephone, with his informed consent, and verified patient privacy and that I am speaking with the correct person using two identifiers.  I was located at home and patient at his office.  We discussed the limitations, risks, and security and privacy concerns associated with telehealth services and the availability of in-person appointments, including awareness that he may be responsible for charges related to the service, and he expressed understanding and agreed to proceed.  I discussed treatment planning with him, with opportunity to ask and answer all questions. Agreed with the plan, demonstrated an understanding of the instructions, and made him aware to call our office if symptoms worsen or he feels he is in a crisis state and needs immediate contact.  Session narrative -- presenting needs, interim history, self-report of stressors and symptoms, applications of prior therapy, status changes, and interventions made in session At request, established family session to include two of his daughters, Justin Marshall (psychology grad student in [place]) and Justin Marshall (in [place]).  7 weeks since last seen (in-person), says he has gotten the ADHD work book, then got URI himself with cough and fever, negative tests for flu and COVID.  Justin Marshall has come over and decontaminated, has been worried he would be a high-risk infectee if stricken with COVID.  Has asked Justin Marshall to order the ADHD workbook for him but not arrived yet.  Justin Marshall asks about therapy emphasis and structure.  Related difficulties scheduling and the frequent focus on handling  interpersonal boundaries, and the priority of managing boundaries effectively given his history of professional oversight and very dysfunctional family dealings.  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 Says he is assessing his options and afraid to risk rejection.    Addressed PT's shame and defensiveness with family, confirming that daughter does not mean to pressure him or judge.  Brought to tears a couple times discussing self-judgment, fear of being found a failure, etc.  Framed goal between PT and daughter for PT to set more concrete goals for working through [employer] and to check in with each other regularly, est. Q 1-2 days.  Justin Marshall will, on her own initiative, set personal goals relevant to her and they will review as adult peers, scrupulously avoiding the sense that one is overseer to the other.  Agreed explicitly that it is important for PT's shame dynamics to have this understanding.    Therapeutic modalities: Cognitive Behavioral Therapy and Solution-Oriented/Positive Psychology  Mental Status/Observations:  Appearance:   Casual and a bit unkempt     Behavior:  dilatory at times  Motor:  Restlestness  Speech/Language:   Clear and Coherent  Affect:  Appropriate and anxious  Mood:  anxious and momentarily dysphoric  Thought process:  tangential  Thought content:    WNL  Sensory/Perceptual disturbances:    WNL  Orientation:  grossly intact  Attention:  Fair  Concentration:  Fair  Memory:  WNL  Insight:    Fair  Judgment:   Fair  Impulse Control:  Fair   Risk Assessment: Danger to Self:  No Self-injurious  Behavior: No Danger to Others: No Duty to Warn:no Physical Aggression / Violence:No  Access to Firearms a concern: No   Diagnosis:   ICD-10-CM   1. Attention deficit hyperactivity disorder (ADHD), combined type F90.2   2. 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 Generalized anxiety disorder F41.1   4. Major depressive disorder, recurrent episode, moderate (HCC) F33.1   5. Relationship problem between parent and child Z62.820    Assessment of progress:  stable  Plan:  Marland Kitchen. Accountability report to each other, as agreed, for personal challenges . Explore ADHD workbook when it arrives . Tackle as much house keeping as is realistically possible, focusing on small goals and seeing them through before switching tasks, and trusting that anything truly important will come back up . Other recommendations/advice as noted above . Continue to utilize previously learned skills ad lib . Maintain medication as prescribed and work faithfully with relevant prescriber(s) if any changes are desired or seem indicated . Call the clinic on-call service, present to ER, or call 911 if any life-threatening psychiatric crisis Return in about 2 weeks (around 01/02/2019) for set up as teletherapy session, will call.   Robley Friesobert Tonisha Silvey, PhD [place] Licensed Psychologist

## 2019-01-08 ENCOUNTER — Other Ambulatory Visit: Payer: Self-pay

## 2019-01-08 ENCOUNTER — Ambulatory Visit (INDEPENDENT_AMBULATORY_CARE_PROVIDER_SITE_OTHER): Payer: Medicare HMO | Admitting: Psychiatry

## 2019-01-08 DIAGNOSIS — Z62820 Parent-biological child conflict: Secondary | ICD-10-CM

## 2019-01-08 DIAGNOSIS — F331 Major depressive disorder, recurrent, moderate: Secondary | ICD-10-CM | POA: Diagnosis not present

## 2019-01-08 DIAGNOSIS — F902 Attention-deficit hyperactivity disorder, combined type: Secondary | ICD-10-CM | POA: Diagnosis not present

## 2019-01-08 DIAGNOSIS — F411 Generalized anxiety disorder: Secondary | ICD-10-CM | POA: Diagnosis not present

## 2019-01-08 DIAGNOSIS — R69 Illness, unspecified: Secondary | ICD-10-CM | POA: Diagnosis not present

## 2019-01-08 DIAGNOSIS — F423 Hoarding disorder: Secondary | ICD-10-CM | POA: Diagnosis not present

## 2019-01-08 NOTE — Progress Notes (Signed)
Psychotherapy Progress Note [facility] Marliss CzarAndy Lyndsey Demos, PhD LP  Patient ID: Justin Marshall J Belford     MRN: [id number]     Therapy format: Family therapy w/ patient -- accompanied by daughters Melburn Hakenna and Amanda Date: 01/08/2019     Start: 4:17p Stop: 5:13p Time Spent: 56 min  Telehealth visit I connected with patient by a video enabled telemedicine/telehealth application or telephone, with his informed consent, and verified patient privacy and that I am speaking with the correct person using two identifiers.  I was located at my home and patient at his office.  We discussed the limitations, risks, and security and privacy concerns associated with telehealth services and the availability of in-person appointments, including awareness that he may be responsible for charges related to the service, and he expressed understanding and agreed to proceed.  I discussed treatment planning with him, with opportunity to ask and answer all questions. Agreed with the plan, demonstrated an understanding of the instructions, and made him aware to call our office if symptoms worsen or he feels he is in a crisis state and needs immediate contact.  Session narrative (presenting needs, interim history, self-report of stressors and symptoms, applications of prior therapy, status changes, and interventions made in session) In trying to set agenda, PT throws back to daughters to review his progress on things that matter to them.  Able to affirm that he is trying to declutter.  With some digging from [employer], PT acknowledges a need for compliments, basically to shore up self-esteem.  Acknowledged among the group without endorsing either codependent comforting or charging their father with emotional manipulation.  Re. goal-setting and mutual check-in agreed with Marchelle FolksAmanda last time, the plan eroded quickly, as PT  seemed to defer calls, be unavailable, or change the subject.  Clarified aims of trying to do that, offered  again the possibility of, simply as two adults in the same family, sharing with each other personally challenging things they are trying to overcome and keep it about that, not any implied judgment (message to PT, mainly, to trust his daughters' hearts about that).    Discussed concern over PT's tendency to self-distract with easier, less intimidating, more interesting tasks to the neglect of more crucial and far-reaching efforts to declutter.  Concern also over his patterns of buying cheap items with ideas in mind and hanging onto broken things promising himself he will restore them to working order.  Acknowledges these habits and how they promote hoarding and slow decluttering, albeit with some justifying.  Agreed it is his call, ultimately which things he sees as more valuable and less, just encourage discernment which ones will actually turn out and matter.  Highlighted intentionality about his activities and the value in setting and sticking to plan for a noticeable amount before switching.  Re. ADHD and workbook recommendation, PT obtained the therapist edition instead of the patient edition.  Daughter will reorder accurately.  Professionally, PHP has sent him a letter acknowledging completion of the monitoring program, a welcome relief.   Wrapped up with goal setting to make a nightly journal entry, answering three questions meant to reframe from shame and avoidance to healthy commitment: (1) Where today did I push myself successfully?  (2) How close to what I intended was the stuff I accomplished today?  (3) What do I care to put myself into tomorrow?  Therapeutic modalities: Cognitive Behavioral Therapy, Solution-Oriented/Positive Psychology, Ego-Supportive and Family Systems  Mental Status/Observations:  Appearance:   Casual and Disheveled  Behavior:  uneasy at times, justifying  Motor:  Normal  Speech/Language:   Clear and Coherent  Affect:  Appropriate and uncomfortable  Mood:   dysthymic and mildly  Thought process:  circumstantial  Thought content:    distracting purposes  Sensory/Perceptual disturbances:    WNL  Orientation:  grossly intact  Attention:  Good  Concentration:  Fair  Memory:  grossly intact  Insight:    Fair  Judgment:   Fair  Impulse Control:  Fair   Risk Assessment: Danger to Self:  No Self-injurious Behavior: No Danger to Others: No Duty to Warn:no Physical Aggression / Violence:No  Access to Firearms a concern: No   Diagnosis:   ICD-10-CM   1. Attention deficit hyperactivity disorder (ADHD), combined type F90.2   2. 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 Generalized anxiety disorder F41.1   4. Relationship problem between parent and child Z62.820   5. Major depressive disorder, recurrent episode, moderate (HCC) F33.1    Assessment of progress:  stable  Plan:  . Nightly journal for non-shaming self-evaluation and practice with acceptance and commitment . Other recommendations/advice as noted above . Continue to utilize previously learned skills ad lib . Maintain medication as prescribed and work faithfully with relevant prescriber(s) if any changes are desired or seem indicated . Call the clinic on-call service, present to ER, or call 911 if any life-threatening psychiatric crisis Return in about 3 weeks (around 01/29/2019).   Robley Fries, PhD [place] Licensed Psychologist

## 2019-01-10 ENCOUNTER — Ambulatory Visit (INDEPENDENT_AMBULATORY_CARE_PROVIDER_SITE_OTHER): Payer: Medicare HMO | Admitting: Psychiatry

## 2019-01-10 ENCOUNTER — Encounter: Payer: Self-pay | Admitting: Psychiatry

## 2019-01-10 DIAGNOSIS — F902 Attention-deficit hyperactivity disorder, combined type: Secondary | ICD-10-CM | POA: Diagnosis not present

## 2019-01-10 DIAGNOSIS — F411 Generalized anxiety disorder: Secondary | ICD-10-CM | POA: Diagnosis not present

## 2019-01-10 DIAGNOSIS — F423 Hoarding disorder: Secondary | ICD-10-CM

## 2019-01-10 DIAGNOSIS — R69 Illness, unspecified: Secondary | ICD-10-CM | POA: Diagnosis not present

## 2019-01-10 DIAGNOSIS — F331 Major depressive disorder, recurrent, moderate: Secondary | ICD-10-CM

## 2019-01-10 MED ORDER — ESCITALOPRAM OXALATE 10 MG PO TABS: 10.0000 mg | ORAL_TABLET | Freq: Every day | ORAL | 1 refills | Status: DC

## 2019-01-10 MED ORDER — AMPHETAMINE-DEXTROAMPHETAMINE 30 MG PO TABS: 30.0000 mg | ORAL_TABLET | Freq: Three times a day (TID) | ORAL | 0 refills | Status: DC

## 2019-01-10 NOTE — Progress Notes (Signed)
Justin Marshall [id number] 05/14/1951 68 y.o.  Virtual Visit via Telephone Note  I connected with@ on 01/10/19 at  4:15 PM EDT by telephone and verified that I am speaking with the correct person using two identifiers.   I discussed the limitations, risks, security and privacy concerns of performing an evaluation and management service by telephone and the availability of in person appointments. I also discussed with the patient that there may be a patient responsible charge related to this service. The patient expressed understanding and agreed to proceed.   I discussed the assessment and treatment plan with the patient. The patient was provided an opportunity to ask questions and all were answered. The patient agreed with the plan and demonstrated an understanding of the instructions.   The patient was advised to call back or seek an in-person evaluation if the symptoms worsen or if the condition fails to improve as anticipated.  I provided 22 minutes of non-face-to-face time during this encounter.  The patient was located at home.  The provider was located at home.   Lauraine Rinnearey G Cottle Jr, MD   Subjective:   Patient ID:  Justin Marshall is a 68 y.o. (DOB 05/14/1951) male.  Chief Complaint:  Chief Complaint  Patient presents with  . ADD    med management  . Anxiety    HPI Justin Marshall presents for follow-up of their ADD, history of anxiety and history of depression.  [facility] change to Adderall from Vyvanse.  Then switched to Adderall 30 TID.    Has alternated between Vyvanse and Adderall 30 TID bc of cost.  Tired more than he wants to be. Tolerating it.    Tend to be depressed more than he wants to be.  Can be depressed and some obsessiveness and can't let go of things.  Wants to restart Lexapro which he's taken before.  Patient reports stable mood and denies irritable moods.  No manic symptoms.  Patient denies any recent difficulty with  anxiety.  Patient denies difficulty with sleep initiation or maintenance. 6-8 hours sleep.   Denies appetite disturbance.  Patient reports that  motivation have been good.  Patient still has problems with concentration.  Patient denies any suicidal ideation.  Past Psychiatric Medication Trials: Modafinil, Strattera tremor, Wellbutrin, Aricept, Vyvanse, lamotrigine, Adzenys, Depakote tremor, Ritalin, Aricept off label without benefit, Lexapro 10  Review of Systems:  Review of Systems  Neurological: Negative for weakness.  Psychiatric/Behavioral: Positive for decreased concentration.    Medications: I have reviewed the patient's current medications.  Current Outpatient Medications  Medication Sig Dispense Refill  . [START ON 02/12/2019] amphetamine-dextroamphetamine (ADDERALL) 30 MG tablet Take 1 tablet by mouth 3 (three) times daily for 30 days. 90 tablet 0  . [START ON 01/15/2019] amphetamine-dextroamphetamine (ADDERALL) 30 MG tablet Take 1 tablet by mouth 3 (three) times daily. 90 tablet 0  . amphetamine-dextroamphetamine (ADDERALL) 30 MG tablet Take 1 tablet by mouth 3 (three) times daily for 30 days. 90 tablet 0  . lamoTRIgine (LAMICTAL) 150 MG tablet Take 150 mg by mouth daily.    Marland Kitchen. lithium carbonate 300 MG capsule Take 1 capsule (300 mg total) by mouth at bedtime. 90 capsule 3  . Vitamin D, Ergocalciferol, (DRISDOL) 50000 units CAPS capsule Take 50,000 Units by mouth daily.    Marland Kitchen. amphetamine-dextroamphetamine (ADDERALL) 30 MG tablet Take 0.5 tablets by mouth 3 (three) times daily. (Patient not taking: Reported on 01/10/2019) 45 tablet 0  .  escitalopram (LEXAPRO) 10 MG tablet Take 1 tablet (10 mg total) by mouth daily. 90 tablet 1  . lisdexamfetamine (VYVANSE) 70 MG capsule Take 1 capsule (70 mg total) by mouth every morning. 30 capsule 0   No current facility-administered medications for this visit.     Medication Side Effects: None  Allergies: No Known Allergies  History reviewed. No  pertinent past medical history.  History reviewed. No pertinent family history.  Social History   Socioeconomic History  . Marital status: Legally Separated    Spouse name: Not on file  . Number of children: Not on file  . Years of education: Not on file  . Highest education level: Not on file  Occupational History  . Not on file  Social Needs  . Financial resource strain: Not on file  . Food insecurity:    Worry: Not on file    Inability: Not on file  . Transportation needs:    Medical: Not on file    Non-medical: Not on file  Tobacco Use  . Smoking status: Never Smoker  . Smokeless tobacco: Never Used  Substance and Sexual Activity  . Alcohol use: Not Currently  . Drug use: Never  . Sexual activity: Not on file  Lifestyle  . Physical activity:    Days per week: Not on file    Minutes per session: Not on file  . Stress: Not on file  Relationships  . Social connections:    Talks on phone: Not on file    Gets together: Not on file    Attends religious service: Not on file    Active member of club or organization: Not on file    Attends meetings of clubs or organizations: Not on file    Relationship status: Not on file  . Intimate partner violence:    Fear of current or ex partner: Not on file    Emotionally abused: Not on file    Physically abused: Not on file    Forced sexual activity: Not on file  Other Topics Concern  . Not on file  Social History Narrative  . Not on file    Past Medical History, Surgical history, Social history, and Family history were reviewed and updated as appropriate.   Please see review of systems for further details on the patient's review from today.   Objective:   Physical Exam:  There were no vitals taken for this visit.  Physical Exam Neurological:     Mental Status: He is alert and oriented to person, place, and time.     Cranial Nerves: No dysarthria.  Psychiatric:        Attention and Perception: Attention normal.         Mood and Affect: Mood normal.        Speech: Speech normal.        Behavior: Behavior is cooperative.        Thought Content: Thought content normal. Thought content is not paranoid or delusional. Thought content does not include homicidal or suicidal ideation. Thought content does not include homicidal or suicidal plan.        Cognition and Memory: Cognition and memory normal.        Judgment: Judgment normal.     Lab Review:     Component Value Date/Time   NA 139 11/15/2018 1235   K 4.2 11/15/2018 1235   CL 104 11/15/2018 1235   CO2 26 11/15/2018 1235   GLUCOSE 117 (H) 11/15/2018 1235  BUN 22 11/15/2018 1235   CREATININE 1.10 11/15/2018 1235   CALCIUM 9.4 11/15/2018 1235   GFRNONAA 69 11/15/2018 1235   GFRAA 80 11/15/2018 1235       Component Value Date/Time   WBC 7.4 11/15/2018 1235   RBC 4.21 11/15/2018 1235   HGB 13.0 (L) 11/15/2018 1235   HCT 39.4 11/15/2018 1235   PLT 287 11/15/2018 1235   MCV 93.6 11/15/2018 1235   MCH 30.9 11/15/2018 1235   MCHC 33.0 11/15/2018 1235   RDW 13.2 11/15/2018 1235   LYMPHSABS 1,029 11/15/2018 1235   EOSABS 104 11/15/2018 1235   BASOSABS 30 11/15/2018 1235    No results found for: POCLITH, LITHIUM   No results found for: PHENYTOIN, PHENOBARB, VALPROATE, CBMZ   .res Assessment: Plan:    Attention deficit hyperactivity disorder (ADHD), combined type  Major depressive disorder, recurrent episode, moderate (HCC)  Generalized anxiety disorder  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   Released from [facility] requirements.  Finished the Program.  Agree with retrial Lexapro 10 for some depressive symptoms and for a tendency to be obsessive which he thinks interferes with his overall productivity.  He has taken this medication in the past with some benefit and he would like to retry it.  As noted before we have examined him for bipolar symptoms repeatedly and not seen evidence for that.  However  he is aware that if he has latent bipolar tendencies this could trigger them and if he has any unusual manic or hypomanic symptoms he will let us know.  Discussed side effects of SSRIs.  Agree with alternating Vyvanse with Adderall over time DT cost of Vyvanse.  He understands however that he cannot take Vyvanse +90 mg of Adderall in a day.  He can take Vyvanse with 15 mg Adderall 3 times daily and on days that he does not take Vyvanse he can take Adderall 30 mg 3 times daily  Discussed potential benefits, risks, and side effects of stimulants with patient to include increased heart rate, palpitations, insomnia, increased anxiety, increased irritability, or decreased appetite.  Instructed patient to contact office if experiencing any significant tolerability issues.  Follow-up 8 weeks  Iona Hansen, MD, DFAPA   Please see After Visit Summary for patient specific instructions.  Future Appointments  Date Time Provider Department Center  01/29/2019  4:00 PM Robley Fries, PhD [facility] None    No orders of the defined types were placed in this encounter.     -------------------------------

## 2019-01-25 ENCOUNTER — Other Ambulatory Visit: Payer: Self-pay | Admitting: Psychiatry

## 2019-01-25 ENCOUNTER — Telehealth: Payer: Self-pay

## 2019-01-25 NOTE — Telephone Encounter (Signed)
According to [facility] will only pay for #30 for 30 days, not #90 for 90 days. Submitted updated quantity.

## 2019-01-25 NOTE — Telephone Encounter (Signed)
Pt was ordered escitalopram 10 mg but [facility] is requesting a lower tier medications such as: citalopram, fluoxetine, paroxetine, sertraline, mirtazapine, trazodone, or venlafaxine. Please advise of a change or continue as ordered?

## 2019-01-25 NOTE — Telephone Encounter (Signed)
I could change to Celexa 20 if he wants which is similar to Lexapro.  But with goodrx he can get escitalopram (Lexapro) 10 from [employer] for $9.70.  Ask which he prefers.

## 2019-01-26 NOTE — Telephone Encounter (Signed)
Left voice mail to call back 

## 2019-01-26 NOTE — Telephone Encounter (Signed)
Pt said [employer] told him that it would be 0 copay, called insurance again they said they would lower the tier. Called [facility] to confirm and it ran as 0 copay now. Pt aware and will pick up. No changes needed at this time.

## 2019-01-29 ENCOUNTER — Ambulatory Visit (INDEPENDENT_AMBULATORY_CARE_PROVIDER_SITE_OTHER): Payer: Medicare HMO | Admitting: Psychiatry

## 2019-01-29 DIAGNOSIS — R69 Illness, unspecified: Secondary | ICD-10-CM | POA: Diagnosis not present

## 2019-01-29 DIAGNOSIS — Z62820 Parent-biological child conflict: Secondary | ICD-10-CM | POA: Diagnosis not present

## 2019-01-29 DIAGNOSIS — F902 Attention-deficit hyperactivity disorder, combined type: Secondary | ICD-10-CM | POA: Diagnosis not present

## 2019-01-29 DIAGNOSIS — F423 Hoarding disorder: Secondary | ICD-10-CM

## 2019-01-29 DIAGNOSIS — F331 Major depressive disorder, recurrent, moderate: Secondary | ICD-10-CM | POA: Diagnosis not present

## 2019-01-29 NOTE — Progress Notes (Signed)
Psychotherapy Progress Note [facility] Marliss Czar, PhD LP  Patient ID: Justin Marshall     MRN: [id number]     Therapy format: Family therapy w/ patient -- accompanied by daughters Melburn Hake Date: 01/29/2019     Start: 4:12p Stop: 5:18p Time Spent: 66 min  Telehealth visit (video) I connected with patient by a video enabled telemedicine/telehealth application or telephone, with his informed consent, and verified patient privacy and that I am speaking with the correct person using two identifiers.  I was located at my office and patient at his office.  We discussed the limitations, risks, and security and privacy concerns associated with telehealth services and the availability of in-person appointments, including awareness that he may be responsible for charges related to the service, and he expressed understanding and agreed to proceed.  I discussed treatment planning with him, with opportunity to ask and answer all questions. Agreed with the plan, demonstrated an understanding of the instructions, and made him aware to call our office if symptoms worsen or he feels he is in a crisis state and needs immediate contact.  Session narrative (presenting needs, interim history, self-report of stressors and symptoms, applications of prior therapy, status changes, and interventions made in session) Has ADHD workbook, challenging to read.  Texting with [name] pretty regularly now in the mornings, with some benefit to morale, if not organization.  Doing some better with his to-do list, but misplaced the three reflection questions given last time.  [name] affirms they have been in more touch, says she has a couple times provided him backup copy of the three self-review questions posed last session, and wants him to try to write down goals/aims for the day, which he is not.  "I keep it in my head", but admittedly not prioritized, very overgrown with many wishes, intentions, and "ought  tos", and subject to making many dilatory decisions a day.  Reaffirmed the value of daily review questions for making the whole business of self-discipline more humane, more immune to resistance, and better at setting tone.  Overall, not as antsy or prone to sidetrack today, though still notably motivated to justify, explain, or kid around to take focus off momentary discomfort.  Last med check worked out to restart of Lexapro, for benefit with anti-obsession and anti-depression, but expensive, c. $140 OOP for 3-mo supply.  Successfully negotiated a discount by calling pharmacy, office, and insurance, and is proud of himself for organizing and representing his needs, affirmed.  With insurance and drug store, has begun Lexapro last 3 days now, feels better.  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  Admits it can be stressful, but overall it is more meaningful, it does function as a home away, and ultimately not ready to turn loose of it, but can declutter in it.  Returning to the subject of intentional time planning and morale building for self-discipline, agreed for PT to put to print (QPM, or QAM if missed) 3 things he wants or cares (  not necessarily should) do for the day as a more reliable marker of his intentions, something to "connect morning guy and evening guy".  Meanwhile, maintain 3 review questions (where did I successfully push myself, how close to my intentions did I come, and what do I care to do tomorrow).    Therapeutic modalities: Cognitive Behavioral Therapy and  Solution-Oriented/Positive Psychology  Mental Status/Observations:  Appearance:   Disheveled     Behavior:  mix of appropriate and some minimizing, evasion -- better  Motor:  Normal and Restlestness  Speech/Language:   Clear and Coherent  Affect:  Appropriate  Mood:  relatively euthymic, more relaxed than before; no sxs mania in early SSRI  Thought process:  circumstantial and tangential, less  Thought content:    WNL and intrusive ideas  Sensory/Perceptual disturbances:    WNL  Orientation:  grossly intact  Attention:  Fair  Concentration:  Fair  Memory:  grossly intact  Insight:    Good  Judgment:   Fair  Impulse Control:  Fair   Risk Assessment: Danger to Self:  No Self-injurious Behavior: No Danger to Others: No Duty to Warn:no Physical Aggression / Violence:No  Access to Firearms a concern: No   Diagnosis:   ICD-10-CM   1. Attention deficit hyperactivity disorder (ADHD), combined type F90.2   2. Major depressive disorder, recurrent episode, moderate (HCC) F33.1   3. 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 Relationship problem between parent and child Z62.820    Assessment of progress:  improving for mood, stable for organization  Plan:  . Institute daily goalsetting (max 3), framed as want or care to do today, and 3 affirmative review questions . Encourage to continue near-daily, nonpejorative text contact with Marchelle FolksAmanda . Delve into ADHD workbook as tolerated . Other recommendations/advice as noted above . Continue to utilize previously learned skills ad lib . Maintain medication as prescribed and work faithfully with relevant prescriber(s) if any changes are desired or seem indicated . Call the clinic on-call service, present to ER, or call 911 if any life-threatening psychiatric crisis Return in about 2 weeks (around 02/12/2019) for set up as teletherapy session.   Robley Friesobert Gurtha Picker, PhD [place] Licensed Psychologist

## 2019-01-30 NOTE — Progress Notes (Signed)
Order(s) created erroneously. Erroneous order ID: [id number]  Order canceled by: Cheryle Horsfall  Order cancel date/time: 01/30/2019 3:01 PM

## 2019-02-06 DIAGNOSIS — M129 Arthropathy, unspecified: Secondary | ICD-10-CM | POA: Diagnosis not present

## 2019-02-12 ENCOUNTER — Other Ambulatory Visit: Payer: Self-pay

## 2019-02-12 ENCOUNTER — Ambulatory Visit (INDEPENDENT_AMBULATORY_CARE_PROVIDER_SITE_OTHER): Payer: Medicare HMO | Admitting: Psychiatry

## 2019-02-12 DIAGNOSIS — F902 Attention-deficit hyperactivity disorder, combined type: Secondary | ICD-10-CM | POA: Diagnosis not present

## 2019-02-12 DIAGNOSIS — Z62820 Parent-biological child conflict: Secondary | ICD-10-CM | POA: Diagnosis not present

## 2019-02-12 DIAGNOSIS — R69 Illness, unspecified: Secondary | ICD-10-CM | POA: Diagnosis not present

## 2019-02-12 DIAGNOSIS — F331 Major depressive disorder, recurrent, moderate: Secondary | ICD-10-CM

## 2019-02-12 DIAGNOSIS — F423 Hoarding disorder: Secondary | ICD-10-CM | POA: Diagnosis not present

## 2019-02-12 DIAGNOSIS — F411 Generalized anxiety disorder: Secondary | ICD-10-CM | POA: Diagnosis not present

## 2019-02-12 NOTE — Progress Notes (Signed)
Psychotherapy Progress Note [facility] Marliss CzarAndy Mallarie Voorhies, PhD LP  Patient ID: Justin Marshall J Kelty     MRN: [id number]     Therapy format: Individual psychotherapy Date: 02/12/2019     Start: 4:20p Stop: 5:10p Time Spent: 50 min  Telehealth visit (audio only) I connected with patient by a video enabled telemedicine/telehealth application or telephone, with his informed consent, and verified patient privacy and that I am speaking with the correct person using two identifiers.  I was located at my office and patient at his office.  We discussed the limitations, risks, and security and privacy concerns associated with telehealth services and the availability of in-person appointments, including awareness that he may be responsible for charges related to the service, and he expressed understanding and agreed to proceed.  I discussed treatment planning with him, with opportunity to ask and answer all questions. Agreed with the plan, demonstrated an understanding of the instructions, and made him aware to call our office if symptoms worsen or he feels he is in a crisis state and needs immediate contact.  Session narrative (presenting needs, interim history, self-report of stressors and symptoms, applications of prior therapy, status changes, and interventions made in session) Left computer at the house, so using his phone only for this meeting.  Trying to write things down  Continues doing the regular accountability conversations with daughter Marchelle Folksmanda.  Has begun to try a [employer] task app that can set reminders for things, but finds he does not want to stop doing what he wants at the moment to do a reminded task.  Continues to use the 3 questions assignment querying himself about plans, what he pushed himself on, and what he would want to se himself do priority the next day.  Today, can claim having gotten three iPhones repaired after hanging onto several of them for a long time.  Saw himself limit  obsession today with an air conditioner project after he saw diminishing returns.  Morale boost to see these results.  Morale boost as well to talk with Shanda BumpsJessica, who offered to come in for Father's Day, fish together at the pond on the property with husband and grandchild.  Newly energized to clean up some more home space for them.  Lexapro seems to be helping with clarity and ability to choose tasks.  Taking friend's advice to get a contractor to help fix a drainage problem that is causing a foundation leak.  Successfully resolved to let a pro work on it.  Meanwhile, tempted to focus on plans to set up a B&B on his property and to clean up the very large pool.    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  [place] clinic slow, suppressed by the COVID pandemic.  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  Enjoying not driving all over for distant workplaces like [place], affirmed that putting himself on full-time in distant locations would probably be disastrous for his goals at this point, but nothing against part-time practice if he wishes, and regulatory issues are taken care of now.  Knows he does not want to be in a position to be hit up for controlled substances.  Re. Practice at [facility] and keeping to priorities, pressed PT to decide one more focal effort for today.  Discussed "low-hanging fruit", decided to go into SW corner of his basement and skim out 1-3 bags of garbage this evening.  Knows he will be tempted on  the one hand to feel like it's not doing enough and he should pick off a large effort, but also knows he could be so distracted by the next thing he sees and the next plan to write down that he will diffuse his efforts, not accomplish much of anything.  Reframed a small raid as an actually big job, because he is not only doing a patch of cleaning up, he is doing strong work resisting distractions while he does that.   Agreed, will get to it tonight.  Meanwhile, concern that Estill Bamberg may have picked up COVID.    Therapeutic modalities: Cognitive Behavioral Therapy, Motivational Interviewing and Solution-Oriented/Positive Psychology  Mental Status/Observations:  Appearance:   Not assessed     Behavior:  Not assessed  Motor:  Not assessed  Speech/Language:   Clear and Coherent  Affect:  Not assessed  Mood:  dysthymic and better energy  Thought process:  tangential and but more goal-directed than usual  Thought content:    overincorporative, but more cognizant of it  Sensory/Perceptual disturbances:    WNL  Orientation:  grossly intact  Attention:  Good  Concentration:  Fair  Memory:  WNL  Insight:    Good  Judgment:   Good  Impulse Control:  Fair   Risk Assessment: Danger to Self:  No Self-injurious Behavior: No Danger to Others: No Duty to Warn:no Physical Aggression / Violence:No  Access to Firearms a concern: No   Diagnosis:   ICD-10-CM   1. Attention deficit hyperactivity disorder (ADHD), combined type  F90.2   2. 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 Major depressive disorder, recurrent episode, moderate (HCC)  F33.1   4. Relationship problem between parent and child  Z62.820   5. Generalized anxiety disorder  F41.1    Assessment of progress:  improving  Plan:  . Continue working the goal-setting activity and accountability check-ins daily  . This evening press through the one short cleanup effort, resisting side jobs . When in doubt, prioritize sheer cleanup over development plans for the property . Other recommendations/advice as noted above . Continue to utilize previously learned skills ad lib . Maintain medication as prescribed and work faithfully with relevant prescriber(s) if any changes are desired or seem indicated . Call the clinic on-call service, present to ER, or call 911 if any life-threatening psychiatric crisis Return  in about 2 weeks (around 02/26/2019) for will call, set up as teletherapy session.   Blanchie Serve, PhD Luan Moore, PhD LP Clinical Psychologist, [facility] [address], [place], [place] [address] [phone]

## 2019-02-28 ENCOUNTER — Other Ambulatory Visit: Payer: Self-pay

## 2019-02-28 ENCOUNTER — Ambulatory Visit: Payer: Medicare HMO | Admitting: Psychiatry

## 2019-03-28 ENCOUNTER — Ambulatory Visit (INDEPENDENT_AMBULATORY_CARE_PROVIDER_SITE_OTHER): Payer: Medicare HMO | Admitting: Psychiatry

## 2019-03-28 ENCOUNTER — Other Ambulatory Visit: Payer: Self-pay

## 2019-03-28 DIAGNOSIS — F331 Major depressive disorder, recurrent, moderate: Secondary | ICD-10-CM | POA: Diagnosis not present

## 2019-03-28 DIAGNOSIS — F423 Hoarding disorder: Secondary | ICD-10-CM

## 2019-03-28 DIAGNOSIS — F411 Generalized anxiety disorder: Secondary | ICD-10-CM | POA: Diagnosis not present

## 2019-03-28 DIAGNOSIS — R69 Illness, unspecified: Secondary | ICD-10-CM | POA: Diagnosis not present

## 2019-03-28 DIAGNOSIS — F902 Attention-deficit hyperactivity disorder, combined type: Secondary | ICD-10-CM | POA: Diagnosis not present

## 2019-03-28 NOTE — Progress Notes (Signed)
Psychotherapy Progress Note [facility] Marliss CzarAndy Imari Reen, PhD LP  Patient ID: Justin Marshall     MRN: [id number]     Therapy format: Individual psychotherapy Date: 03/28/2019     Start: 5:08p Stop: 5:57p Time Spent: 49 min Location: telehealth   Telehealth visit -- I connected with this patient by an approved telecommunication method (video), with Justin Marshall informed consent, and verifying identity and patient privacy.  I was located at my office and patient at Justin Marshall home.  As needed, we discussed the limitations, risks, and security and privacy concerns associated with telehealth service, including the availability and conditions which currently govern in-person appointments and the possibility that 3rd-party payment may not be fully guaranteed and he may be responsible for charges.  After he indicated understanding, we proceeded with the session.  Also discussed treatment planning, as needed, including ongoing verbal agreement with the plan, the opportunity to ask and answer all questions, Justin Marshall demonstrated understanding of instructions, and Justin Marshall readiness to call the office should symptoms worsen or he feels he is in a crisis state and needs more immediate and tangible assistance.  Session narrative (presenting needs, interim history, self-report of stressors and symptoms, applications of prior therapy, status changes, and interventions made in session) Home a couple days lately, getting things done and out of pandemic's way.  Got to see daughter Tobi Bastosnna and boyfriend.  Marchelle Folksmanda has asked after Justin Marshall journaling habit, which is mental only at this point.  Feels he is in more of a habit of taking initiative, and quicker to stop shaming himself.  Is finding plenty of errands to tend at the house.  Encouraged to prioritize getting a little something done each day and minimize decision time.    Been meaning to give blood, but low Hgb (mid-12 on finger stick).  B12 level low normal, has restarted MVI.   Realized while speaking that he is out of vit D for 3 weeks.  Encouraged to keep B12 and Vit D up for anti-depression, informed about MTHFR as a possible cause of persistent lows and cognitive/emotional hardship.  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  Receptionist and one health care worker came down with it, one requiring oxygen at home.  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  Is seeing current conditions possibly as a reason to refocus Justin Marshall energies, and for the most part trusts his NP and the staff to figure it out.    Continuing struggle to press through decluttering projects, still frequent temptation to peel off and do side projects.  Did restore a bicycle cart for taking 3yo granddaughter for a ride next visit.  Has asked a neighbor for recommendations for house and yard help, got a joint work session.  Unfortunately, the man who helped asked to come fish at [place] pond, has been taking liberties by bringing family members without asking.  Housekeeper some help.  Therapeutic modalities: Cognitive Behavioral Therapy, Solution-Oriented/Positive Psychology and Psycho-education/Bibliotherapy  Mental Status/Observations:  Appearance:   Casual     Behavior:  Appropriate  Motor:  Normal  Speech/Language:   clear  Affect:  Appropriate  Mood:  anxious and moderately  Thought process:  mildy tangential  Thought content:    WNL  Sensory/Perceptual disturbances:  WNL  Orientation:  grossly intact  Attention:  Good  Concentration:  Fair  Memory:  grossly intact  Insight:    Good  Judgment:   Good  Impulse Control:  Fair   Risk Assessment: Danger to Self: No Self-injurious Behavior: No Danger to Others: No Physical Aggression / Violence:  No Duty to Warn: No Access to Firearms a concern: No  Assessment of progress:  progressing  Diagnosis:   ICD-10-CM   1. Attention deficit hyperactivity disorder (ADHD), combined type  F90.2   2. 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 Generalized anxiety disorder  F41.1   4. Major depressive disorder, recurrent episode, moderate (HCC)  F33.1    Plan:  . Worth better supplying B12, D, etc. regardless   . Consider need for further testing of vitamins, further supplementation . Possibly MTHFR testing . Prioritize productivity with home cleanup -- decide a little something, do a little something, minimal decision time, try not to sidetrack . Other recommendations/advice as noted above . Continue to utilize previously learned skills ad lib . Maintain medication as prescribed and work faithfully with relevant prescriber(s) if any changes are desired or seem indicated . Call the clinic on-call service, present to ER, or call 911 if any life-threatening psychiatric crisis Return in about 1 month (around 04/28/2019) for will call, set up as teletherapy session.  Blanchie Serve, PhD Luan Moore, PhD LP Clinical Psychologist, [facility] [address], [place], [place] [address] [phone]

## 2019-03-29 ENCOUNTER — Ambulatory Visit (INDEPENDENT_AMBULATORY_CARE_PROVIDER_SITE_OTHER): Payer: Medicare HMO | Admitting: Psychiatry

## 2019-03-29 ENCOUNTER — Encounter: Payer: Self-pay | Admitting: Psychiatry

## 2019-03-29 ENCOUNTER — Other Ambulatory Visit: Payer: Self-pay

## 2019-03-29 DIAGNOSIS — R69 Illness, unspecified: Secondary | ICD-10-CM | POA: Diagnosis not present

## 2019-03-29 DIAGNOSIS — F423 Hoarding disorder: Secondary | ICD-10-CM

## 2019-03-29 DIAGNOSIS — F411 Generalized anxiety disorder: Secondary | ICD-10-CM

## 2019-03-29 DIAGNOSIS — F902 Attention-deficit hyperactivity disorder, combined type: Secondary | ICD-10-CM | POA: Diagnosis not present

## 2019-03-29 DIAGNOSIS — F331 Major depressive disorder, recurrent, moderate: Secondary | ICD-10-CM | POA: Diagnosis not present

## 2019-03-29 MED ORDER — AMPHETAMINE-DEXTROAMPHETAMINE 30 MG PO TABS: 30.0000 mg | ORAL_TABLET | Freq: Three times a day (TID) | ORAL | 0 refills | Status: DC

## 2019-03-29 MED ORDER — ESCITALOPRAM OXALATE 10 MG PO TABS: 10.0000 mg | ORAL_TABLET | Freq: Every day | ORAL | 0 refills | Status: DC

## 2019-03-29 MED ORDER — L-METHYLFOLATE 15 MG PO TABS: 15.0000 mg | ORAL_TABLET | Freq: Every day | ORAL | 5 refills | Status: DC

## 2019-03-29 NOTE — Progress Notes (Signed)
Justin Marshall [id number] Mar 12, 1951 68 y.o.  Virtual Visit via Telephone Note  I connected with@ on 03/29/19 at  4:00 PM EDT by telephone and verified that I am speaking with the correct person using two identifiers.   I discussed the limitations, risks, security and privacy concerns of performing an evaluation and management service by telephone and the availability of in person appointments. I also discussed with the patient that there may be a patient responsible charge related to this service. The patient expressed understanding and agreed to proceed.   I discussed the assessment and treatment plan with the patient. The patient was provided an opportunity to ask questions and all were answered. The patient agreed with the plan and demonstrated an understanding of the instructions.   The patient was advised to call back or seek an in-person evaluation if the symptoms worsen or if the condition fails to improve as anticipated.  I provided 20 minutes of non-face-to-face time during this encounter.  The patient was located at home.  The provider was located at home.   Lauraine Rinnearey G Cottle Jr, MD   Subjective:   Patient ID:  Justin Marshall is a 68 y.o. (DOB Mar 12, 1951) male.  Chief Complaint:  Chief Complaint  Patient presents with  . Follow-up    Medication Management  . ADD    Medication Management    HPI Justin Marshall presents for follow-up of their ADD, history of anxiety and history of depression.  Last visit Jan 10, 2019.  We initiated a return to Lexapro 20 mg for some depressive and obsessive symptoms.  He was alternating between Vyvanse and Adderall because of cost.  Feels better with less depression and less negative and less obsessive negative thinking.  His clinic closed bc of Covid affecting the clinic.  Still struggles getting things done bc overwhelmed with demands.  May be a little anemic and that causes some tiredness.  B12 is normal.    BC insurance he had to  change to Adderall from Vyvanse.  Then switched to Adderall 30 TID.    Has alternated between Vyvanse and Adderall 30 TID bc of cost.  Tired more than he wants to be. Tolerating it.    Tend to be depressed more than he wants to be.  Can be depressed and some obsessiveness and can't let go of things.  Wants to restart Lexapro which he's taken before.  Patient reports stable mood and denies irritable moods.  No manic symptoms.  Patient denies any recent difficulty with anxiety.  Patient denies difficulty with sleep initiation or maintenance. 6-8 hours sleep.   Denies appetite disturbance.  Patient reports that  motivation have been good.  Patient still has problems with concentration.  Patient denies any suicidal ideation.  Past Psychiatric Medication Trials: Modafinil, Strattera tremor, Wellbutrin, Aricept, Vyvanse, lamotrigine, Adzenys, Depakote tremor, Ritalin, Aricept off label without benefit, Lexapro 10  Review of Systems:  Review of Systems  Neurological: Negative for weakness.  Psychiatric/Behavioral: Positive for decreased concentration.    Medications: I have reviewed the patient's current medications.  Current Outpatient Medications  Medication Sig Dispense Refill  . amphetamine-dextroamphetamine (ADDERALL) 30 MG tablet Take 1 tablet by mouth 3 (three) times daily. 90 tablet 0  . [START ON 04/26/2019] amphetamine-dextroamphetamine (ADDERALL) 30 MG tablet Take 1 tablet by mouth 3 (three) times daily. 90 tablet 0  . [START ON 05/24/2019] amphetamine-dextroamphetamine (ADDERALL) 30 MG tablet Take 1 tablet by mouth 3 (three) times daily. 90 tablet 0  .  escitalopram (LEXAPRO) 10 MG tablet Take 1 tablet (10 mg total) by mouth daily. 90 tablet 0  . lamoTRIgine (LAMICTAL) 150 MG tablet Take 150 mg by mouth daily.    Marland Kitchen. lithium carbonate 300 MG capsule Take 1 capsule (300 mg total) by mouth at bedtime. 90 capsule 3  . Vitamin D3 (VITAMIN D) 25 MCG tablet Take 5,000 Units by mouth daily.    Marland Kitchen.  L-Methylfolate 15 MG TABS Take 1 tablet (15 mg total) by mouth daily. 30 tablet 5   No current facility-administered medications for this visit.     Medication Side Effects: None  Allergies: No Known Allergies  History reviewed. No pertinent past medical history.  History reviewed. No pertinent family history.  Social History   Socioeconomic History  . Marital status: Legally Separated    Spouse name: Not on file  . Number of children: Not on file  . Years of education: Not on file  . Highest education level: Not on file  Occupational History  . Not on file  Social Needs  . Financial resource strain: Not on file  . Food insecurity    Worry: Not on file    Inability: Not on file  . Transportation needs    Medical: Not on file    Non-medical: Not on file  Tobacco Use  . Smoking status: Never Smoker  . Smokeless tobacco: Never Used  Substance and Sexual Activity  . Alcohol use: Not Currently  . Drug use: Never  . Sexual activity: Not on file  Lifestyle  . Physical activity    Days per week: Not on file    Minutes per session: Not on file  . Stress: Not on file  Relationships  . Social Musicianconnections    Talks on phone: Not on file    Gets together: Not on file    Attends religious service: Not on file    Active member of club or organization: Not on file    Attends meetings of clubs or organizations: Not on file    Relationship status: Not on file  . Intimate partner violence    Fear of current or ex partner: Not on file    Emotionally abused: Not on file    Physically abused: Not on file    Forced sexual activity: Not on file  Other Topics Concern  . Not on file  Social History Narrative  . Not on file    Past Medical History, Surgical history, Social history, and Family history were reviewed and updated as appropriate.   Please see review of systems for further details on the patient's review from today.   Objective:   Physical Exam:  There were no  vitals taken for this visit.  Physical Exam Neurological:     Mental Status: He is alert and oriented to person, place, and time.     Cranial Nerves: No dysarthria.  Psychiatric:        Attention and Perception: Attention normal.        Mood and Affect: Mood normal.        Speech: Speech normal.        Behavior: Behavior is cooperative.        Thought Content: Thought content normal. Thought content is not paranoid or delusional. Thought content does not include homicidal or suicidal ideation. Thought content does not include homicidal or suicidal plan.        Cognition and Memory: Cognition and memory normal.  Judgment: Judgment normal.     Lab Review:     Component Value Date/Time   NA 139 11/15/2018 1235   K 4.2 11/15/2018 1235   CL 104 11/15/2018 1235   CO2 26 11/15/2018 1235   GLUCOSE 117 (H) 11/15/2018 1235   BUN 22 11/15/2018 1235   CREATININE 1.10 11/15/2018 1235   CALCIUM 9.4 11/15/2018 1235   GFRNONAA 69 11/15/2018 1235   GFRAA 80 11/15/2018 1235       Component Value Date/Time   WBC 7.4 11/15/2018 1235   RBC 4.21 11/15/2018 1235   HGB 13.0 (L) 11/15/2018 1235   HCT 39.4 11/15/2018 1235   PLT 287 11/15/2018 1235   MCV 93.6 11/15/2018 1235   MCH 30.9 11/15/2018 1235   MCHC 33.0 11/15/2018 1235   RDW 13.2 11/15/2018 1235   LYMPHSABS 1,029 11/15/2018 1235   EOSABS 104 11/15/2018 1235   BASOSABS 30 11/15/2018 1235    No results found for: POCLITH, LITHIUM   No results found for: PHENYTOIN, PHENOBARB, VALPROATE, CBMZ   .res Assessment: Plan:    Justin Marshall was seen today for follow-up and add.  Diagnoses and all orders for this visit:  Attention deficit hyperactivity disorder (ADHD), combined type -     amphetamine-dextroamphetamine (ADDERALL) 30 MG tablet; Take 1 tablet by mouth 3 (three) times daily. -     amphetamine-dextroamphetamine (ADDERALL) 30 MG tablet; Take 1 tablet by mouth 3 (three) times daily. -     amphetamine-dextroamphetamine  (ADDERALL) 30 MG tablet; Take 1 tablet by mouth 3 (three) times daily.  Major depressive disorder, recurrent episode, moderate (HCC) -     L-Methylfolate 15 MG TABS; Take 1 tablet (15 mg total) by mouth daily. -     escitalopram (LEXAPRO) 10 MG tablet; Take 1 tablet (10 mg total) by mouth daily.  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 -     escitalopram (LEXAPRO) 10 MG tablet; Take 1 tablet (10 mg total) by mouth daily.   [facility]  Finished [facility].  Continue Lexapro 10 for some depressive symptoms and for a tendency to be obsessive which he thinks interferes with his overall productivity.  He has taken this medication in the past and has noticed benefit again this time..  As noted before we have examined him for bipolar symptoms repeatedly and not seen evidence for that.  However he is aware that if he has latent bipolar tendencies this could trigger them and if he has any unusual manic or hypomanic symptoms he will let us know.  Discussed side effects of SSRIs.  For cost reasons he has stopped Vyvanse and switched to Adderall 30 mg 3 times daily.  He is aware this is a high dose but feels it is medically necessary.  His ADD has been severe and chronic and it is appropriate.  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  He had a recent physical exam with Dr. Duane LopeAlan Ross that was unremarkable.  He is going to follow-up with his cardiologist as a matter of routine  Discussed potential benefits, risks, and side effects of stimulants with patient to include increased heart rate, palpitations, insomnia, increased anxiety, increased irritability, or decreased appetite.  Instructed patient to contact office if experiencing any significant tolerability issues.  Discussed  the option of  l-methylfolate 15 mg as an augmentation strategy for boosting the effect of antidepressants as well as potentially helping with his energy level.  He would like to start it.  He will tried it for 2 to 3 months to see if it helps. [facility]  He is out of his vitamin D but plans to restart it.  He is continuing counseling with Dr. Barron Schmid  Follow-up 3 to 4 months  Hiram Comber, MD, DFAPA   Please see After Visit Summary for patient specific instructions.  Future Appointments  Date Time Provider Bud  04/25/2019  6:00 PM Blanchie Serve, PhD CP-CP None    No orders of the defined types were placed in this encounter.     -------------------------------

## 2019-04-25 ENCOUNTER — Ambulatory Visit (INDEPENDENT_AMBULATORY_CARE_PROVIDER_SITE_OTHER): Payer: Medicare HMO | Admitting: Psychiatry

## 2019-04-25 ENCOUNTER — Other Ambulatory Visit: Payer: Self-pay

## 2019-04-25 DIAGNOSIS — F902 Attention-deficit hyperactivity disorder, combined type: Secondary | ICD-10-CM

## 2019-04-25 DIAGNOSIS — F411 Generalized anxiety disorder: Secondary | ICD-10-CM

## 2019-04-25 DIAGNOSIS — F423 Hoarding disorder: Secondary | ICD-10-CM | POA: Diagnosis not present

## 2019-04-25 DIAGNOSIS — F331 Major depressive disorder, recurrent, moderate: Secondary | ICD-10-CM

## 2019-04-25 DIAGNOSIS — R69 Illness, unspecified: Secondary | ICD-10-CM | POA: Diagnosis not present

## 2019-04-25 NOTE — Progress Notes (Signed)
Psychotherapy Progress Note [facility] Justin Moore, PhD LP  Patient ID: Justin Marshall     MRN: [id number]     Therapy format: Individual psychotherapy Date: 04/25/2019     Start: 6:25p Stop: 7:00p Time Spent: 35 min Location: telehealth   Telehealth visit -- I connected with this patient by an approved telecommunication method (video), with his informed consent, and verifying identity and patient privacy.  I was located at my office and patient at his home.  As needed, we discussed the limitations, risks, and security and privacy concerns associated with telehealth service, including the availability and conditions which currently govern in-person appointments and the possibility that 3rd-party payment may not be fully guaranteed and he may be responsible for charges.  After he indicated understanding, we proceeded with the session.  Also discussed treatment planning, as needed, including ongoing verbal agreement with the plan, the opportunity to ask and answer all questions, his demonstrated understanding of instructions, and his readiness to call the office should symptoms worsen or he feels he is in a crisis state and needs more immediate and tangible assistance.  Session narrative (presenting needs, interim history, self-report of stressors and symptoms, applications of prior therapy, status changes, and interventions made in session) Been seeing more of daughter Justin Marshall on Wednesdays, and enjoying it, although her 1yo daughter is in a screaming mode (teething?).  Justin Marshall calling, wants him to visit when possible, has a near 67yo herself.  Overall good relations with daughters.  Hometown [place], [place]) in the main path for [employer], unexpectedly upgraded to cat 4 today.  Younger brother Justin Marshall there, figures to ride it out.  Dr. Clovis Pu ordered methylfolate, but discount would not apply at pharmacy, cost $100.  Blood count 13 1/2 lately, may be able to give blood  again.  Will get in touch with the office daytime about guidance finding affordable alternative cost for methylfolate supplement.   Continues to experience a lot of distraction moving through his home and office environments, starting one thing after another sometimes.  Still more productive than before embarking on more focused ADHD management.  Has been helpful to work on self-criticism, freeing up effort, and been helpful to set goals and engage, for initiative's sake.  Discussed whether to work more on persistence, taking on one high-value task and trying to move through it without sidetracking.  Reviewed priority tasks -- needs to get back to delinquent tax returns, provisionally resolved to put in time cleaning in study looking for tax records, try to see CPA Friday.  Encourage to make a plan, do the plan, take [place]'s image with him   Professionally, still working with the clinic in [place], which has reopened after a cluster of COVID infections.  Realizes again he was overextending himself with the traveling jobs before.  With COVID and quieter times, he is finding himself "so lonesome."  Still bette than the conflict he used to have with Justin Marshall.  Might miss Justin Marshall's calls, crazy as they got.  Last known stable and under state guardianship in a group home in [place].  Next session, resolved to   Therapeutic modalities: Cognitive Behavioral Therapy and Solution-Oriented/Positive Psychology  Mental Status/Observations:  Appearance:   Casual     Behavior:  Appropriate, preoccupied  Motor:  Normal  Speech/Language:   Clear and Coherent  Affect:  Appropriate  Mood:  normal  Thought process:  less tangential  Thought content:    understandably preoccupied  Sensory/Perceptual disturbances:  WNL  Orientation:  grossly intact  Attention:  Good  Concentration:  Good  Memory:  grossly intact  Insight:    Good  Judgment:   Good  Impulse Control:  Fair   Risk  Assessment: Danger to Self: No Self-injurious Behavior: No Danger to Others: No Physical Aggression / Violence: No Duty to Warn: No Access to Firearms a concern: No  Assessment of progress:  progressing  Diagnosis:   ICD-10-CM   1. Attention deficit hyperactivity disorder (ADHD), combined type  F90.2   2. 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 Generalized anxiety disorder  F41.1   4. Major depressive disorder, recurrent episode, moderate (HCC)  F33.1    Plan:  . Prioritize picking out a couple of cleanup tasks in a day and work without sidetracking, even if they are relatively short or small portions of a larger job . Priority project this week getting tax records together (more motivating given expected refund) . Other recommendations/advice as noted above . Continue to utilize previously learned skills ad lib . Maintain medication as prescribed and work faithfully with relevant prescriber(s) if any changes are desired or seem indicated . Call the clinic on-call service, present to ER, or call 911 if any life-threatening psychiatric crisis Return in about 1 month (around 05/26/2019) for will call, teletherapy.  Justin Friesobert Justin Pounders, PhD Justin CzarAndy Beaux Wedemeyer, PhD LP Clinical Psychologist, [facility] [address], [address] [place], [place] [address] [phone]([phone]

## 2019-05-17 ENCOUNTER — Telehealth: Payer: Self-pay | Admitting: Psychiatry

## 2019-05-17 ENCOUNTER — Other Ambulatory Visit: Payer: Self-pay

## 2019-05-17 MED ORDER — LAMOTRIGINE 150 MG PO TABS: 150.0000 mg | ORAL_TABLET | Freq: Every day | ORAL | 1 refills | Status: DC

## 2019-05-17 NOTE — Telephone Encounter (Signed)
Refill submitted for lamotrigine 150 mg #90

## 2019-05-17 NOTE — Telephone Encounter (Signed)
Patient need refill on Lamotragene 150 mg, [facility] [place].  Has appt., 11/04

## 2019-05-23 ENCOUNTER — Other Ambulatory Visit: Payer: Self-pay

## 2019-05-23 ENCOUNTER — Ambulatory Visit (INDEPENDENT_AMBULATORY_CARE_PROVIDER_SITE_OTHER): Payer: Medicare HMO | Admitting: Psychiatry

## 2019-05-23 DIAGNOSIS — F411 Generalized anxiety disorder: Secondary | ICD-10-CM | POA: Diagnosis not present

## 2019-05-23 DIAGNOSIS — Z638 Other specified problems related to primary support group: Secondary | ICD-10-CM

## 2019-05-23 DIAGNOSIS — R69 Illness, unspecified: Secondary | ICD-10-CM | POA: Diagnosis not present

## 2019-05-23 DIAGNOSIS — F902 Attention-deficit hyperactivity disorder, combined type: Secondary | ICD-10-CM

## 2019-05-23 DIAGNOSIS — Z789 Other specified health status: Secondary | ICD-10-CM
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
## 2019-05-28 ENCOUNTER — Encounter: Payer: Self-pay | Admitting: Cardiology

## 2019-05-28 ENCOUNTER — Other Ambulatory Visit: Payer: Self-pay

## 2019-05-28 ENCOUNTER — Ambulatory Visit (INDEPENDENT_AMBULATORY_CARE_PROVIDER_SITE_OTHER): Payer: Medicare HMO | Admitting: Cardiology

## 2019-05-28 VITALS — BP 133/72 | HR 69 | Temp 97.8°F | Ht 68.0 in | Wt 157.5 lb

## 2019-05-28 DIAGNOSIS — R0609 Other forms of dyspnea: Secondary | ICD-10-CM | POA: Diagnosis not present

## 2019-05-28 DIAGNOSIS — E785 Hyperlipidemia, unspecified: Secondary | ICD-10-CM | POA: Diagnosis not present

## 2019-05-28 DIAGNOSIS — I700 Atherosclerosis of aorta: Secondary | ICD-10-CM | POA: Diagnosis not present

## 2019-05-28 DIAGNOSIS — Z9889 Other specified postprocedural states: Secondary | ICD-10-CM

## 2019-05-28 DIAGNOSIS — R0600 Dyspnea, unspecified: Secondary | ICD-10-CM

## 2019-05-28 MED ORDER — ATORVASTATIN CALCIUM 10 MG PO TABS: 10.0000 mg | ORAL_TABLET | Freq: Every day | ORAL | 2 refills | Status: DC

## 2019-05-28 NOTE — Progress Notes (Signed)
Primary Physician/Referring:  Lawerance Cruel, MD  Patient ID: Justin Marshall, male    DOB: [birth year], 68 y.o.   MRN: [id number]  Chief Complaint  Patient presents with  . Shortness of Breath  . Follow-up   HPI:     Justin Marshall  is a 68 y.o. Caucasian male witgh h/o mitral valve repair for severe mitral regurgitation by Dr. Rosalyn Gess at [facility] in 2006. In 10/16/1998 he has had a repeat coronary angiography for shortness of breath and abnormal EKG and was found to have no significant coronary artery disease with a normal ejection fraction and no evidence of mitral regurgitation. He was last seen by me in number of 2018, he made this appointment for routine follow-up and also states that he has been having slight worsening dyspnea.  On further questioning, he still continues to be relatively active, he was able to ride his bike for 5 miles a week ago and 3-1/2 miles 3 days ago.  Denies any chest pain, dizziness or palpitations.  Past Medical History:  Diagnosis Date  . ADHD   . Anxiety and depression   . Vitamin D insufficiency    Past Surgical History:  Procedure Laterality Date  . MITRAL VALVE REPAIR  07/18/2005   Social History   Socioeconomic History  . Marital status: Divorced    Spouse name: Not on file  . Number of children: 4  . Years of education: Not on file  . Highest education level: Not on file  Occupational History  . Not on file  Social Needs  . Financial resource strain: Not on file  . Food insecurity    Worry: Not on file    Inability: Not on file  . Transportation needs    Medical: Not on file    Non-medical: Not on file  Tobacco Use  . Smoking status: Never Smoker  . Smokeless tobacco: Never Used  Substance and Sexual Activity  . Alcohol use: Yes    Comment: occas  . Drug use: Never  . Sexual activity: Not on file  Lifestyle  . Physical activity    Days per week: Not on file    Minutes per session: Not on file  . Stress: Not on  file  Relationships  . Social Herbalist on phone: Not on file    Gets together: Not on file    Attends religious service: Not on file    Active member of club or organization: Not on file    Attends meetings of clubs or organizations: Not on file    Relationship status: Not on file  . Intimate partner violence    Fear of current or ex partner: Not on file    Emotionally abused: Not on file    Physically abused: Not on file    Forced sexual activity: Not on file  Other Topics Concern  . Not on file  Social History Narrative  . Not on file   ROS  Review of Systems  Constitution: Negative for chills, decreased appetite, malaise/fatigue and weight gain.  Cardiovascular: Positive for dyspnea on exertion. Negative for leg swelling and syncope.  Endocrine: Negative for cold intolerance.  Hematologic/Lymphatic: Does not bruise/bleed easily.  Musculoskeletal: Negative for joint swelling.  Gastrointestinal: Positive for heartburn (occasional). Negative for abdominal pain, anorexia, change in bowel habit, hematochezia and melena.  Neurological: Negative for headaches and light-headedness.  Psychiatric/Behavioral: Negative for depression and substance abuse.  All other systems reviewed and  are negative.  Objective   Vitals with BMI 05/28/2019 11/15/2018 07/16/2009  Height _0  _1  _2   Weight 157 lbs 8 oz 154 lbs 150 lbs  BMI 23.95 44.96 75.9  Systolic 163 846 659  Diastolic 72 64 67  Pulse 69 99 -  Some encounter information is confidential and restricted. Go to Review Flowsheets activity to see all data.    Blood pressure 133/72, pulse 69, temperature 97.8 F (36.6 C), height _3  (1.727 m), weight 157 lb 8 oz (71.4 kg), SpO2 98 %. Body mass index is 23.95 kg/m.   Physical Exam  Constitutional: He appears well-developed and well-nourished. No distress.  HENT:  Head: Atraumatic.  Eyes: Conjunctivae are normal.  Neck: Neck supple. No JVD present. No thyromegaly  present.  Cardiovascular: Normal rate, regular rhythm, normal heart sounds and intact distal pulses. Exam reveals no gallop.  No murmur heard. Pulmonary/Chest: Effort normal and breath sounds normal.  Abdominal: Soft. Bowel sounds are normal.  Musculoskeletal: Normal range of motion.  Neurological: He is alert.  Skin: Skin is warm and dry.  Psychiatric: He has a normal mood and affect.   Radiology: No results found.  Laboratory examination:   Recent Labs    11/15/18 1235  NA 139  K 4.2  CL 104  CO2 26  GLUCOSE 117*  BUN 22  CREATININE 1.10  CALCIUM 9.4  GFRNONAA 69  GFRAA 80   CMP Latest Ref Rng & Units 11/15/2018  Glucose 65 - 99 mg/dL 117(H)  BUN 7 - 25 mg/dL 22  Creatinine 0.70 - 1.25 mg/dL 1.10  Sodium 135 - 146 mmol/L 139  Potassium 3.5 - 5.3 mmol/L 4.2  Chloride 98 - 110 mmol/L 104  CO2 20 - 32 mmol/L 26  Calcium 8.6 - 10.3 mg/dL 9.4   CBC Latest Ref Rng & Units 11/15/2018  WBC 3.8 - 10.8 Thousand/uL 7.4  Hemoglobin 13.2 - 17.1 g/dL 13.0(L)  Hematocrit 38.5 - 50.0 % 39.4  Platelets 140 - 400 Thousand/uL 287   Lipid Panel  No results found for: CHOL, TRIG, HDL, CHOLHDL, VLDL, LDLCALC, LDLDIRECT HEMOGLOBIN A1C Lab Results  Component Value Date   HGBA1C 5.7 (H) 11/15/2018   MPG 117 11/15/2018   TSH No results for input(s): TSH in the last 8760 hours. Medications  No Known Allergies   Prior to Admission medications   Medication Sig Start Date End Date Taking? Authorizing Provider  amphetamine-dextroamphetamine (ADDERALL) 30 MG tablet Take 1 tablet by mouth 3 (three) times daily. 03/29/19  Yes Cottle, Billey Co., MD  aspirin EC 81 MG tablet Take 81 mg by mouth. occas   Yes [provider]  Cholecalciferol (VITAMIN D3) 1.25 MG (50000 UT) CAPS Take 5,000 Units by mouth once a week.    Yes [provider]  escitalopram (LEXAPRO) 10 MG tablet Take 1 tablet (10 mg total) by mouth daily. 03/29/19  Yes Cottle, Billey Co., MD  lamoTRIgine  (LAMICTAL) 150 MG tablet Take 1 tablet (150 mg total) by mouth daily. 05/17/19  Yes Cottle, Billey Co., MD  lithium carbonate 300 MG capsule Take 1 capsule (300 mg total) by mouth at bedtime. 09/13/18  Yes Cottle, Billey Co., MD  Multiple Vitamin (MULTIVITAMIN) capsule Take 1 capsule by mouth daily.   Yes [provider]  naproxen sodium (ALEVE) 220 MG tablet Take 220 mg by mouth. Prn wrist pain   Yes [provider]  Omega 3 1200 MG CAPS Take by mouth.  1-2 caps daily   Yes [provider]     Current Outpatient Medications  Medication Instructions  . amphetamine-dextroamphetamine (ADDERALL) 30 MG tablet 1 tablet, Oral, 3 times daily  . aspirin EC 81 mg, Oral, occas   . atorvastatin (LIPITOR) 10 mg, Oral, Daily  . escitalopram (LEXAPRO) 10 mg, Oral, Daily  . lamoTRIgine (LAMICTAL) 150 mg, Oral, Daily  . lithium carbonate 300 mg, Oral, Daily at bedtime  . Multiple Vitamin (MULTIVITAMIN) capsule 1 capsule, Oral, Daily  . naproxen sodium (ALEVE) 220 mg, Oral, Prn wrist pain   . Omega 3 1200 MG CAPS Oral, 1-2 caps daily   . Vitamin D3 5,000 Units, Oral, Weekly    Cardiac Studies:   Echocardiogram 09/12/2017: Left ventricle cavity is normal in size. Normal global wall motion. Normal diastolic filling pattern. Calculated EF 70%. Stable mitral valve repair.  Mild mitral regurgitation. No evidence of pulmonary hypertension. No significant change compared to prior study in 2015.  Treadmill exercise stress test 09/12/2017: Indication: Shortness of breath, H/O Mitral valve repair.  The patient exercised on Bruce protocol for 12:00 min. Patient achieved  11.17 METS and reached HR  167 bpm, which is  107 % of maximum age-predicted HR.  Stress test terminated due to fatigue.  Resting EKG demonstrates Normal sinus rhythm. ST Changes: With peak exercise there was no ST-T changes of ischemia. Arrhythmias: none. BP Response to Exercise: Normal resting BP- appropriate response.  Exercise capacity was above average for age. Continue primary/secondary prevention.   Assessment     ICD-10-CM   1. Dyspnea on exertion  R06.09 EKG 12-Lead  2. H/O mitral valve repair  [phone]    2006 at [facility]  3. Aortic atherosclerosis (HCC)  I70.0 atorvastatin (LIPITOR) 10 MG tablet    CMP14+EGFR  4. Mild hyperlipidemia  E78.5 Lipid Panel With LDL/HDL Ratio    LDL cholesterol, direct    EKG 05/20/2019: Normal sinus rhythm at rate of 68 bpm, normal axis, no evidence of ischemia, normal EKG.  Chest x-ray PA and lateral view 11/15/2018: The heart size and mediastinal contours are within normal limits. Both lungs are clear. The visualized skeletal structures are  unremarkable. Wires suture and clips anterior right chest. Aortic atherosclerosis.  Recommendations:   Patient is here on a 2-year visit for follow-up of mitral valve repair, I reviewed his echocardiogram done almost 2 years ago which reveals excellent repair.  Physical examination is completely normal.  I reviewed his chest x-ray, he also has mild aortic atherosclerosis.  His dyspnea on exertion that he has been complaining more several years and has had extensive evaluation including cardiac catheterization in 2000 has been unyielding and 2 years ago his treadmill stress test was completely normal with excellent exercise tolerance.  I offered him to repeat a stress test versus observation.  It may be that he may be developing mild diastolic dysfunction and age-related decreased exercise tolerance.  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  In spite of this I have recommended low-dose statin in view of aortic atherosclerosis.  I will obtain a CMP along with lipid profile testing in 4 to 6 weeks.  I do not think he needs any other testing for now, I will see him back on an annual basis.  Adrian Prows, MD,  [facility] 05/28/2019, 4:46 PM [facility] Pager: [phone] Office: [phone]  If no answer Cell [phone]

## 2019-06-20 ENCOUNTER — Other Ambulatory Visit: Payer: Self-pay

## 2019-06-20 ENCOUNTER — Ambulatory Visit (INDEPENDENT_AMBULATORY_CARE_PROVIDER_SITE_OTHER): Payer: Medicare HMO | Admitting: Psychiatry

## 2019-06-20 DIAGNOSIS — F902 Attention-deficit hyperactivity disorder, combined type: Secondary | ICD-10-CM

## 2019-06-20 DIAGNOSIS — F3181 Bipolar II disorder: Secondary | ICD-10-CM

## 2019-06-20 DIAGNOSIS — R69 Illness, unspecified: Secondary | ICD-10-CM | POA: Diagnosis not present

## 2019-06-20 DIAGNOSIS — Z638 Other specified problems related to primary support group: Secondary | ICD-10-CM

## 2019-06-20 DIAGNOSIS — F411 Generalized anxiety disorder: Secondary | ICD-10-CM
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
## 2019-07-04 ENCOUNTER — Ambulatory Visit (INDEPENDENT_AMBULATORY_CARE_PROVIDER_SITE_OTHER): Payer: Medicare HMO | Admitting: Psychiatry

## 2019-07-04 ENCOUNTER — Other Ambulatory Visit: Payer: Self-pay

## 2019-07-04 ENCOUNTER — Encounter: Payer: Self-pay | Admitting: Psychiatry

## 2019-07-04 DIAGNOSIS — F411 Generalized anxiety disorder: Secondary | ICD-10-CM | POA: Diagnosis not present

## 2019-07-04 DIAGNOSIS — F331 Major depressive disorder, recurrent, moderate: Secondary | ICD-10-CM | POA: Diagnosis not present

## 2019-07-04 DIAGNOSIS — F902 Attention-deficit hyperactivity disorder, combined type: Secondary | ICD-10-CM | POA: Diagnosis not present

## 2019-07-04 DIAGNOSIS — R69 Illness, unspecified: Secondary | ICD-10-CM | POA: Diagnosis not present

## 2019-07-04 MED ORDER — AMPHETAMINE-DEXTROAMPHETAMINE 30 MG PO TABS: 30.0000 mg | ORAL_TABLET | Freq: Three times a day (TID) | ORAL | 0 refills | Status: DC

## 2019-07-04 NOTE — Progress Notes (Signed)
Justin Marshall [id number] 11-Sep-1950 68 y.o.  Virtual Visit via WebEX  I connected with pt by WebEx and verified that I am speaking with the correct person using two identifiers.   I discussed the limitations, risks, security and privacy concerns of performing an evaluation and management service by [facility] and the availability of in person appointments. I also discussed with the patient that there may be a patient responsible charge related to this service. The patient expressed understanding and agreed to proceed.  I discussed the assessment and treatment plan with the patient. The patient was provided an opportunity to ask questions and all were answered. The patient agreed with the plan and demonstrated an understanding of the instructions.   The patient was advised to call back or seek an in-person evaluation if the symptoms worsen or if the condition fails to improve as anticipated.  I provided 30 minutes of video time during this encounter. The call started at 400 and ended at 4:30. The patient was located at home and the provider was located office.   Subjective:   Patient ID:  Justin Marshall is a 68 y.o. (DOB 14-Dec-1950) male.  Chief Complaint:  Chief Complaint  Patient presents with  . Follow-up    Medication Management  . ADHD    Medication Management    HPI Justin Marshall presents for follow-up of their ADD, history of anxiety and history of depression.  At visit Jan 10, 2019.  We initiated a return to Lexapro 20 mg for some depressive and obsessive symptoms.  He was alternating between Vyvanse and Adderall because of cost.  Last visit March 29, 2019.  He agreed to a trial of L methyl folate and getting back on vitamin D for mental health reasons.  Pretty good overall.  Good sleep.  L-methylfolate too expensive.  Iron level low.  Taking vitamin D usually.    Feels better with less depression and less negative and less obsessive negative thinking.  His clinic closed bc of  Covid affecting the clinic.  Still struggles getting things done bc overwhelmed with demands.  May be a little anemic and that causes some tiredness.  B12 is normal.    BC insurance he had to change to Adderall from Vyvanse.  Then switched to Adderall 30 TID.    Has alternated between Vyvanse and Adderall 30 TID bc of cost.  Tired more than he wants to be. Tolerating it.    Riding bike.   Tend to be depressed more than he wants to be.  Can be depressed and some obsessiveness and can't let go of things.  Wants to restart Lexapro which he's taken before.  Patient reports stable mood and denies irritable moods.  No manic symptoms.  Patient denies any recent difficulty with anxiety.  Patient denies difficulty with sleep initiation or maintenance. 6-8 hours sleep.   Denies appetite disturbance.  Patient reports that  motivation have been good.  Patient still has problems with concentration.  Patient denies any suicidal ideation.  Past Psychiatric Medication Trials: Modafinil, Strattera tremor, Wellbutrin, Aricept, Vyvanse, Ritalin, Aricept off label without benefit, Adzenys, lamotrigine,  Depakote tremor,  Lexapro 10  Review of Systems:  Review of Systems  Neurological: Negative for weakness.  Psychiatric/Behavioral: Positive for decreased concentration.    Medications: I have reviewed the patient's current medications.  Current Outpatient Medications  Medication Sig Dispense Refill  . amphetamine-dextroamphetamine (ADDERALL) 30 MG tablet Take 1 tablet by mouth 3 (three) times daily. 90 tablet  0  . aspirin EC 81 MG tablet Take 81 mg by mouth. occas    . atorvastatin (LIPITOR) 10 MG tablet Take 1 tablet (10 mg total) by mouth daily. 30 tablet 2  . Cholecalciferol (VITAMIN D3) 1.25 MG (50000 UT) CAPS Take 5,000 Units by mouth once a week.     . escitalopram (LEXAPRO) 10 MG tablet Take 1 tablet (10 mg total) by mouth daily. 90 tablet 0  . lamoTRIgine (LAMICTAL) 150 MG tablet Take 1 tablet (150  mg total) by mouth daily. 90 tablet 1  . lithium carbonate 300 MG capsule Take 1 capsule (300 mg total) by mouth at bedtime. 90 capsule 3  . Multiple Vitamin (MULTIVITAMIN) capsule Take 1 capsule by mouth daily.    . naproxen sodium (ALEVE) 220 MG tablet Take 220 mg by mouth. Prn wrist pain    . Omega 3 1200 MG CAPS Take by mouth. 1-2 caps daily     No current facility-administered medications for this visit.     Medication Side Effects: None  Allergies: No Known Allergies  Past Medical History:  Diagnosis Date  . ADHD   . Anxiety and depression   . Vitamin D insufficiency     Family History  Problem Relation Age of Onset  . Heart failure Father   . Diabetes Father   . Diabetes Sister   . Bipolar disorder Sister   . Heart disease Brother   . Hepatitis C Brother   . Depression Brother     Social History   Socioeconomic History  . Marital status: Divorced    Spouse name: Not on file  . Number of children: 4  . Years of education: Not on file  . Highest education level: Not on file  Occupational History  . Not on file  Social Needs  . Financial resource strain: Not on file  . Food insecurity    Worry: Not on file    Inability: Not on file  . Transportation needs    Medical: Not on file    Non-medical: Not on file  Tobacco Use  . Smoking status: Never Smoker  . Smokeless tobacco: Never Used  Substance and Sexual Activity  . Alcohol use: Yes    Comment: occas  . Drug use: Never  . Sexual activity: Not on file  Lifestyle  . Physical activity    Days per week: Not on file    Minutes per session: Not on file  . Stress: Not on file  Relationships  . Social Musician on phone: Not on file    Gets together: Not on file    Attends religious service: Not on file    Active member of club or organization: Not on file    Attends meetings of clubs or organizations: Not on file    Relationship status: Not on file  . Intimate partner violence    Fear of  current or ex partner: Not on file    Emotionally abused: Not on file    Physically abused: Not on file    Forced sexual activity: Not on file  Other Topics Concern  . Not on file  Social History Narrative  . Not on file    Past Medical History, Surgical history, Social history, and Family history were reviewed and updated as appropriate.   Please see review of systems for further details on the patient's review from today.   Objective:   Physical Exam:  There were no vitals  taken for this visit.  Physical Exam Neurological:     Mental Status: He is alert and oriented to person, place, and time.     Cranial Nerves: No dysarthria.  Psychiatric:        Attention and Perception: Attention normal.        Mood and Affect: Mood normal.        Speech: Speech normal.        Behavior: Behavior is cooperative.        Thought Content: Thought content normal. Thought content is not paranoid or delusional. Thought content does not include homicidal or suicidal ideation. Thought content does not include homicidal or suicidal plan.        Cognition and Memory: Cognition and memory normal.        Judgment: Judgment normal.     Comments: Insight good.       Lab Review:     Component Value Date/Time   NA 139 11/15/2018 1235   K 4.2 11/15/2018 1235   CL 104 11/15/2018 1235   CO2 26 11/15/2018 1235   GLUCOSE 117 (H) 11/15/2018 1235   BUN 22 11/15/2018 1235   CREATININE 1.10 11/15/2018 1235   CALCIUM 9.4 11/15/2018 1235   GFRNONAA 69 11/15/2018 1235   GFRAA 80 11/15/2018 1235       Component Value Date/Time   WBC 7.4 11/15/2018 1235   RBC 4.21 11/15/2018 1235   HGB 13.0 (L) 11/15/2018 1235   HCT 39.4 11/15/2018 1235   PLT 287 11/15/2018 1235   MCV 93.6 11/15/2018 1235   MCH 30.9 11/15/2018 1235   MCHC 33.0 11/15/2018 1235   RDW 13.2 11/15/2018 1235   LYMPHSABS 1,029 11/15/2018 1235   EOSABS 104 11/15/2018 1235   BASOSABS 30 11/15/2018 1235    No results found for:  POCLITH, LITHIUM   No results found for: PHENYTOIN, PHENOBARB, VALPROATE, CBMZ   .res Assessment: Plan:    There are no diagnoses linked to this encounter. Released from [employer] requirements.  Finished the Program.  Continue Lexapro 10 for some depressive symptoms and for a tendency to be obsessive which he thinks interferes with his overall productivity.  He has taken this medication in the past and has noticed benefit again this time..  As noted before we have examined him for bipolar symptoms repeatedly and not seen evidence for that.  However he is aware that if he has latent bipolar tendencies this could trigger them and if he has any unusual manic or hypomanic symptoms he will let us know.  Discussed side effects of SSRIs.  For cost reasons he has stopped Vyvanse and switched to Adderall 30 mg 3 times daily.  He is aware this is a high dose but feels it is medically necessary.  His ADD has been severe and chronic and it is appropriate.  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  He had a recent physical exam with Dr. Melinda Crutch that was  unremarkable.  He is going to follow-up with his cardiologist as a matter of routine discussed the risk of between dosage sedation if he is not careful to space them out.  He is aware.  Discussed potential benefits, risks, and side effects of stimulants with patient to include increased heart rate, palpitations, insomnia, increased anxiety, increased irritability, or decreased appetite.  Instructed patient to contact office if experiencing any significant tolerability issues.  L-methylfolate was too expensive.  He could  consider it again using good Rx but that is not convenient to him.  Continue lithium 30 mg daily.  He plans to continue vitamin D.  He is continuing counseling with Dr. Laurell RoofAndrew Mitchum  Follow-up 5 months  Iona Hansenarey, MD, DFAPA   Please see After Visit Summary for patient specific  instructions.  Future Appointments  Date Time Provider Department Center  08/01/2019 11:00 AM Robley FriesMitchum, Robert, PhD [facility] None    No orders of the defined types were placed in this encounter.     -------------------------------

## 2019-08-01 ENCOUNTER — Ambulatory Visit (INDEPENDENT_AMBULATORY_CARE_PROVIDER_SITE_OTHER): Payer: Medicare HMO | Admitting: Psychiatry

## 2019-08-01 ENCOUNTER — Other Ambulatory Visit: Payer: Self-pay

## 2019-08-01 DIAGNOSIS — F423 Hoarding disorder: Secondary | ICD-10-CM | POA: Diagnosis not present

## 2019-08-01 DIAGNOSIS — F411 Generalized anxiety disorder: Secondary | ICD-10-CM

## 2019-08-01 DIAGNOSIS — F902 Attention-deficit hyperactivity disorder, combined type: Secondary | ICD-10-CM

## 2019-08-01 DIAGNOSIS — R69 Illness, unspecified: Secondary | ICD-10-CM | POA: Diagnosis not present

## 2019-08-01 DIAGNOSIS — F331 Major depressive disorder, recurrent, moderate: Secondary | ICD-10-CM
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
## 2019-08-05 ENCOUNTER — Other Ambulatory Visit: Payer: Self-pay | Admitting: Psychiatry

## 2019-08-05 ENCOUNTER — Other Ambulatory Visit: Payer: Self-pay | Admitting: Cardiology

## 2019-08-05 DIAGNOSIS — F411 Generalized anxiety disorder: Secondary | ICD-10-CM

## 2019-08-05 DIAGNOSIS — I700 Atherosclerosis of aorta: Secondary | ICD-10-CM

## 2019-08-05 DIAGNOSIS — F331 Major depressive disorder, recurrent, moderate: Secondary | ICD-10-CM

## 2019-08-29 DIAGNOSIS — R69 Illness, unspecified: Secondary | ICD-10-CM | POA: Diagnosis not present

## 2019-09-04 ENCOUNTER — Telehealth: Payer: Self-pay | Admitting: Psychiatry

## 2019-09-04 ENCOUNTER — Other Ambulatory Visit: Payer: Self-pay | Admitting: Psychiatry

## 2019-09-04 DIAGNOSIS — F902 Attention-deficit hyperactivity disorder, combined type: Secondary | ICD-10-CM

## 2019-09-04 MED ORDER — AMPHETAMINE-DEXTROAMPHETAMINE 30 MG PO TABS: 30.0000 mg | ORAL_TABLET | Freq: Three times a day (TID) | ORAL | 0 refills | Status: DC

## 2019-09-04 NOTE — Telephone Encounter (Signed)
Justin Marshall called to request refill of her Adderall. Appt 12/12/19.  [facility] [place]

## 2019-09-04 NOTE — Telephone Encounter (Signed)
Prescription sent for 3 months

## 2019-09-07 ENCOUNTER — Telehealth: Payer: Self-pay

## 2019-09-07 NOTE — Telephone Encounter (Signed)
Prior authorization faxed for quantity limi to [facility] for Amphetamine-dextroamphetamine 30 mg tablets tid dosing #90 for 30 days, approved effective 08/31/2019-08/29/2020 through his [employer]

## 2019-09-28 ENCOUNTER — Ambulatory Visit (INDEPENDENT_AMBULATORY_CARE_PROVIDER_SITE_OTHER): Payer: Medicare HMO | Admitting: Psychiatry

## 2019-09-28 DIAGNOSIS — F902 Attention-deficit hyperactivity disorder, combined type: Secondary | ICD-10-CM | POA: Diagnosis not present

## 2019-09-28 DIAGNOSIS — Z789 Other specified health status: Secondary | ICD-10-CM

## 2019-09-28 DIAGNOSIS — F423 Hoarding disorder: Secondary | ICD-10-CM | POA: Diagnosis not present

## 2019-09-28 DIAGNOSIS — F411 Generalized anxiety disorder: Secondary | ICD-10-CM | POA: Diagnosis not present

## 2019-09-28 DIAGNOSIS — F331 Major depressive disorder, recurrent, moderate: Secondary | ICD-10-CM | POA: Diagnosis not present

## 2019-09-28 DIAGNOSIS — Z638 Other specified problems related to primary support group: Secondary | ICD-10-CM

## 2019-09-28 DIAGNOSIS — R69 Illness, unspecified: Secondary | ICD-10-CM | POA: Diagnosis not present
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
## 2019-10-17 DIAGNOSIS — Z0000 Encounter for general adult medical examination without abnormal findings: Secondary | ICD-10-CM | POA: Diagnosis not present

## 2019-10-28 ENCOUNTER — Other Ambulatory Visit: Payer: Self-pay | Admitting: Psychiatry

## 2019-11-20 ENCOUNTER — Other Ambulatory Visit: Payer: Self-pay | Admitting: Cardiology

## 2019-11-20 DIAGNOSIS — I700 Atherosclerosis of aorta: Secondary | ICD-10-CM

## 2019-11-21 ENCOUNTER — Ambulatory Visit (INDEPENDENT_AMBULATORY_CARE_PROVIDER_SITE_OTHER): Payer: Medicare HMO | Admitting: Psychiatry

## 2019-11-21 DIAGNOSIS — F331 Major depressive disorder, recurrent, moderate: Secondary | ICD-10-CM | POA: Diagnosis not present

## 2019-11-21 DIAGNOSIS — R69 Illness, unspecified: Secondary | ICD-10-CM | POA: Diagnosis not present

## 2019-11-21 DIAGNOSIS — F423 Hoarding disorder: Secondary | ICD-10-CM

## 2019-11-21 DIAGNOSIS — F902 Attention-deficit hyperactivity disorder, combined type: Secondary | ICD-10-CM

## 2019-11-21 NOTE — Progress Notes (Signed)
Psychotherapy Progress Note [facility] Luan Moore, PhD LP  Patient ID: Justin Marshall     MRN: [id number] Therapy format: Individual psychotherapy Date: 11/21/2019      Start: 10:15a     Stop: 11:05a     Time Spent: 50 min Location: Telehealth visit -- I connected with this patient by an approved telecommunication method (video), with his informed consent, and verifying identity and patient privacy.  I was located at my office and patient at his home.  As needed, we discussed the limitations, risks, and security and privacy concerns associated with telehealth service, including the availability and conditions which currently govern in-person appointments and the possibility that 3rd-party payment may not be fully guaranteed and he may be responsible for charges.  After he indicated understanding, we proceeded with the session.  Also discussed treatment planning, as needed, including ongoing verbal agreement with the plan, the opportunity to ask and answer all questions, his demonstrated understanding of instructions, and his readiness to call the office should symptoms worsen or he feels he is in a crisis state and needs more immediate and tangible assistance.   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  Deeply mixed emotions, a lot of stuff and a lot of emotional attachments.    Estill Bamberg decided to come over and help tidy up one weekend, [name] joined her, and the two of them got him a push through some clutter.  They got him a new washer/dryer in the process (washer leaking, prefers to take his drying to the office, where there is a gas dryer ... one of many labyrinths he can go into chasing the illusion of  efficiency, realizes it's unrealistic once he's out of the moment).  They also took some laundry, worked through some paper decisions.  Felt antsy at times, having this things moved around, some moments where he needed to not see it, one night when [employer] was playing basketball and he felt compelled to watch instead of help.  In all, the girls were gracious, things got accomplished, and he is grateful, but still dealing with the sense of upheaval.  Nice walking into the bedroom and not seeing a mess, though, and he has been to [place] to return some of the favor by caring for grandchild.    Took an Medical laboratory scientific officer out of the attic, cleaned it up, took it to [employer] for the 68yo girl's birthday.  [name] initially tried to block it coming in, but the kids played with it and other relatives found it nostalgic, including ex-W Sharyn Lull.  Felt good to succeed in gifting heirlooms out of what has been regarded as junk.  On balance, felt like he did get the dignity of being asked about taking things out, has been able to gift some other things.  Painful to see some things staged for decisions, looking like taking over, but overall, would do it again if given the chance.  Estill Bamberg, in fact, declined an award ceremony to stay through Monday and get more done.    Has some other things pressing, like expired car registration (got stopped), and first decent haircut in a year.  Also caring for a cat with neurological symptoms, feral, captured over two years ago.  Typically  many other side interests, ideas, and projects that can capture his attention and divert his energy.  The girls have given him lists to guide and stimulate further working through his clutter and asked after therapy, which he has more sparsely scheduled since his license issue lifted.  They recommended a whiteboard to where he could list things he needed to do and check them off, which makes general sense for more focused, motivated people, but pretty clear  that PT still is reflexively shaming toward himself when thinking about to-dos, and it only invites the tendency to overwhelm himself with the inventory of things that need doing, the magnitude of the task, the irresistible urge to sidetrack or procrastinate, etc.  Reviewed the journal format he was using last spring (1 affirmation, 1 wish, 1 intention tomorrow), while partnering more actively with daughters on treatment.  Recommended he resume the practice if possible, but specifically for motivation working through the still-monumental task of decluttering, recommended the list he make focus only on crediting things he gets done in a day, not good intentions or plans or "shoulds".  May be a log, or brief journal statements, and he may if desired acknowledge needs and regrets, but the overarching point would be to show himself work done, decluttering accomplished, and give himself the credit and encouragement no one else is present to give.  PT likened it to the end of the old "Mission Impossible" TV show.  Stressed the importance of tone in his thoughts, being there for himself the way others may not have been.  Agrees, will try.  Therapeutic modalities: Cognitive Behavioral Therapy and Solution-Oriented/Positive Psychology  Mental Status/Observations:  Appearance:   Casual     Behavior:  Appropriate  Motor:  Normal  Speech/Language:   Clear and Coherent  Affect:  Appropriate  Mood:  dysthymic  Thought process:  normal and small tangents  Thought content:    worry, self-criticism  Sensory/Perceptual disturbances:    WNL  Orientation:  Fully oriented  Attention:  Good  Concentration:  Fair  Memory:  grossly intact  Insight:    Good  Judgment:   Fair  Impulse Control:  Fair   Risk Assessment: Danger to Self: No Self-injurious Behavior: No Danger to Others: No Physical Aggression / Violence: No Duty to Warn: No Access to Firearms a concern: No  Assessment of progress:   stabilized  Diagnosis:   ICD-10-CM   1. 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 Attention deficit hyperactivity disorder (ADHD), combined type  F90.2   3. Major depressive disorder, recurrent episode, moderate (HCC)  F33.1    Plan:  Marland Kitchen Affirmation-only journal -- just accomplishments in decluttering, nothing about to-do list or "shoulds" without a clear pat on the back for items actually done . Focus on setting small enough intentions and starting anyway, come hell or high water . As needed, try to look at the work of decluttering as honoring daughter's gifts coming in to help him . Other recommendations/advice as may be noted above . Continue to utilize previously learned skills ad lib . Maintain medication as prescribed and work faithfully with relevant prescriber(s) if any changes are desired or seem indicated . Call the clinic on-call service, present to ER, or call 911 if any life-threatening psychiatric crisis Return in about 3 weeks (around 12/12/2019) for will call. . Already scheduled visit in this office 12/12/2019.  Robley Fries, PhD Marliss Czar, PhD LP Clinical Psychologist, [facility] [address], [place], [place] [address] [phone]

## 2019-11-28 ENCOUNTER — Other Ambulatory Visit: Payer: Self-pay | Admitting: Psychiatry

## 2019-11-28 DIAGNOSIS — F411 Generalized anxiety disorder: Secondary | ICD-10-CM

## 2019-11-28 DIAGNOSIS — F331 Major depressive disorder, recurrent, moderate: Secondary | ICD-10-CM

## 2019-12-05 ENCOUNTER — Other Ambulatory Visit: Payer: Self-pay

## 2019-12-05 ENCOUNTER — Telehealth: Payer: Self-pay | Admitting: Psychiatry

## 2019-12-05 DIAGNOSIS — F902 Attention-deficit hyperactivity disorder, combined type: Secondary | ICD-10-CM

## 2019-12-05 MED ORDER — AMPHETAMINE-DEXTROAMPHETAMINE 30 MG PO TABS: 30.0000 mg | ORAL_TABLET | Freq: Three times a day (TID) | ORAL | 0 refills | Status: DC

## 2019-12-05 NOTE — Telephone Encounter (Signed)
Aneta Mins called to request refill of his Adderall.  Appt 12/12/19.  Please send to [facility] in [place], [place]

## 2019-12-05 NOTE — Telephone Encounter (Signed)
Last refill 11/04/2019, pended for Dr. Jennelle Human to submit

## 2019-12-05 NOTE — Telephone Encounter (Signed)
Error

## 2019-12-12 ENCOUNTER — Encounter: Payer: Self-pay | Admitting: Psychiatry

## 2019-12-12 ENCOUNTER — Ambulatory Visit (INDEPENDENT_AMBULATORY_CARE_PROVIDER_SITE_OTHER): Payer: Medicare HMO | Admitting: Psychiatry

## 2019-12-12 DIAGNOSIS — F330 Major depressive disorder, recurrent, mild: Secondary | ICD-10-CM

## 2019-12-12 DIAGNOSIS — F411 Generalized anxiety disorder: Secondary | ICD-10-CM | POA: Diagnosis not present

## 2019-12-12 DIAGNOSIS — F423 Hoarding disorder: Secondary | ICD-10-CM | POA: Diagnosis not present

## 2019-12-12 DIAGNOSIS — F902 Attention-deficit hyperactivity disorder, combined type: Secondary | ICD-10-CM

## 2019-12-12 DIAGNOSIS — Z789 Other specified health status: Secondary | ICD-10-CM | POA: Diagnosis not present

## 2019-12-12 DIAGNOSIS — F331 Major depressive disorder, recurrent, moderate: Secondary | ICD-10-CM

## 2019-12-12 DIAGNOSIS — R69 Illness, unspecified: Secondary | ICD-10-CM | POA: Diagnosis not present

## 2019-12-12 MED ORDER — ESCITALOPRAM OXALATE 10 MG PO TABS: 10.0000 mg | ORAL_TABLET | Freq: Every day | ORAL | 0 refills | Status: DC

## 2019-12-12 MED ORDER — AMPHETAMINE-DEXTROAMPHETAMINE 30 MG PO TABS: 30.0000 mg | ORAL_TABLET | Freq: Three times a day (TID) | ORAL | 0 refills | Status: DC

## 2019-12-12 NOTE — Progress Notes (Signed)
Justin Marshall [id number] [birth year] 69 y.o.  Virtual Visit via [name]  I connected with pt by WebEx and verified that I am speaking with the correct person using two identifiers.   I discussed the limitations, risks, security and privacy concerns of performing an evaluation and management service by [name] and the availability of in person appointments. I also discussed with the patient that there may be a patient responsible charge related to this service. The patient expressed understanding and agreed to proceed.  I discussed the assessment and treatment plan with the patient. The patient was provided an opportunity to ask questions and all were answered. The patient agreed with the plan and demonstrated an understanding of the instructions.   The patient was advised to call back or seek an in-person evaluation if the symptoms worsen or if the condition fails to improve as anticipated.  I provided 30 minutes of video time during this encounter. The call started at 500 and ended at 5:30. The patient was located at home and the provider was located office.   Subjective:   Patient ID:  Justin Marshall is a 69 y.o. (DOB 11/05/1950) male.  Chief Complaint:  Chief Complaint  Patient presents with  . ADHD  . Follow-up    meds    HPI Justin Marshall presents for follow-up of their ADD, history of anxiety and history of depression.  At visit Jan 10, 2019.  We initiated a return to Lexapro 20 mg for some depressive and obsessive symptoms.  He was alternating between Vyvanse and Adderall because of cost.  visit March 29, 2019.  He agreed to a trial of L methyl folate and getting back on vitamin D for mental health reasons.  Last visit November 2020.  No med changes. Continues  Pretty good overall.  Good sleep.  L-methylfolate too expensive.  Iron level low.  Taking vitamin D usually. D got on his nerves decluttering him and throwing things away.     Gets distracted cleaning and straightening  up.   Semi retired working 3 days a week.  Feels better with less depression and less negative and less obsessive negative thinking.  His clinic closed bc of Covid affecting the clinic.  Still struggles getting things done bc overwhelmed with demands.  May be a little anemic and that causes some tiredness.  B12 is normal.    Riding bike.   Tend to be depressed more than he wants to be.  Can be depressed and some obsessiveness and can't let go of things.    Patient reports stable mood and denies irritable moods.  No manic symptoms.  Patient denies any recent difficulty with anxiety.  Patient denies difficulty with sleep initiation or maintenance. 6-8 hours sleep.   Denies appetite disturbance.  Patient reports that  motivation have been good.  Patient still has problems with concentration.  Patient denies any suicidal ideation.  Past Psychiatric Medication Trials: Modafinil, Strattera tremor, Wellbutrin, Aricept, Vyvanse, Ritalin, Aricept off label without benefit, Adzenys, lamotrigine,  Depakote tremor,  Lexapro 10  Review of Systems:  Review of Systems  Constitutional: Positive for fatigue.  Neurological: Negative for weakness.  Psychiatric/Behavioral: Positive for decreased concentration.    Medications: I have reviewed the patient's current medications.  Current Outpatient Medications  Medication Sig Dispense Refill  . amphetamine-dextroamphetamine (ADDERALL) 30 MG tablet Take 1 tablet by mouth 3 (three) times daily. 90 tablet 0  . [START ON 01/09/2020] amphetamine-dextroamphetamine (ADDERALL) 30 MG tablet Take 1 tablet by  mouth 3 (three) times daily. 90 tablet 0  . [START ON 02/06/2020] amphetamine-dextroamphetamine (ADDERALL) 30 MG tablet Take 1 tablet by mouth 3 (three) times daily. 90 tablet 0  . aspirin EC 81 MG tablet Take 81 mg by mouth. occas    . atorvastatin (LIPITOR) 10 MG tablet TAKE 1 TABLET BY MOUTH EVERY DAY 90 tablet 0  . Cholecalciferol (VITAMIN D3) 1.25 MG (50000 UT)  CAPS Take 5,000 Units by mouth once a week.     . escitalopram (LEXAPRO) 10 MG tablet Take 1 tablet (10 mg total) by mouth daily. 180 tablet 0  . lamoTRIgine (LAMICTAL) 150 MG tablet TAKE 1 TABLET BY MOUTH EVERY DAY 90 tablet 1  . lithium carbonate 300 MG capsule TAKE 1 CAPSULE BY MOUTH EVERYDAY AT BEDTIME 90 capsule 3  . Multiple Vitamin (MULTIVITAMIN) capsule Take 1 capsule by mouth daily.    . naproxen sodium (ALEVE) 220 MG tablet Take 220 mg by mouth. Prn wrist pain    . Omega 3 1200 MG CAPS Take by mouth. 1-2 caps daily     No current facility-administered medications for this visit.    Medication Side Effects: None  Allergies: No Known Allergies  Past Medical History:  Diagnosis Date  . ADHD   . Anxiety and depression   . Vitamin D insufficiency     Family History  Problem Relation Age of Onset  . Heart failure Father   . Diabetes Father   . Diabetes Sister   . Bipolar disorder Sister   . Heart disease Brother   . Hepatitis C Brother   . Depression Brother     Social History   Socioeconomic History  . Marital status: Divorced    Spouse name: Not on file  . Number of children: 4  . Years of education: Not on file  . Highest education level: Not on file  Occupational History  . Not on file  Tobacco Use  . Smoking status: Never Smoker  . Smokeless tobacco: Never Used  Substance and Sexual Activity  . Alcohol use: Yes    Comment: occas  . Drug use: Never  . Sexual activity: Not on file  Other Topics Concern  . Not on file  Social History Narrative  . Not on file   Social Determinants of Health   Financial Resource Strain:   . Difficulty of Paying Living Expenses:   Food Insecurity:   . Worried About Programme researcher, broadcasting/film/video in the Last Year:   . Barista in the Last Year:   Transportation Needs:   . Freight forwarder (Medical):   Marland Kitchen Lack of Transportation (Non-Medical):   Physical Activity:   . Days of Exercise per Week:   . Minutes of  Exercise per Session:   Stress:   . Feeling of Stress :   Social Connections:   . Frequency of Communication with Friends and Family:   . Frequency of Social Gatherings with Friends and Family:   . Attends Religious Services:   . Active Member of Clubs or Organizations:   . Attends Banker Meetings:   Marland Kitchen Marital Status:   Intimate Partner Violence:   . Fear of Current or Ex-Partner:   . Emotionally Abused:   Marland Kitchen Physically Abused:   . Sexually Abused:     Past Medical History, Surgical history, Social history, and Family history were reviewed and updated as appropriate.   Please see review of systems for further details on the  patient's review from today.   Objective:   Physical Exam:  There were no vitals taken for this visit.  Physical Exam Neurological:     Mental Status: He is alert and oriented to person, place, and time.     Cranial Nerves: No dysarthria.  Psychiatric:        Attention and Perception: Attention normal.        Mood and Affect: Mood normal.        Speech: Speech normal.        Behavior: Behavior is cooperative.        Thought Content: Thought content normal. Thought content is not paranoid or delusional. Thought content does not include homicidal or suicidal ideation. Thought content does not include homicidal or suicidal plan.        Cognition and Memory: Cognition and memory normal.        Judgment: Judgment normal.     Comments: Insight good.       Lab Review:     Component Value Date/Time   NA 139 11/15/2018 1235   K 4.2 11/15/2018 1235   CL 104 11/15/2018 1235   CO2 26 11/15/2018 1235   GLUCOSE 117 (H) 11/15/2018 1235   BUN 22 11/15/2018 1235   CREATININE 1.10 11/15/2018 1235   CALCIUM 9.4 11/15/2018 1235   GFRNONAA 69 11/15/2018 1235   GFRAA 80 11/15/2018 1235       Component Value Date/Time   WBC 7.4 11/15/2018 1235   RBC 4.21 11/15/2018 1235   HGB 13.0 (L) 11/15/2018 1235   HCT 39.4 11/15/2018 1235   PLT 287  11/15/2018 1235   MCV 93.6 11/15/2018 1235   MCH 30.9 11/15/2018 1235   MCHC 33.0 11/15/2018 1235   RDW 13.2 11/15/2018 1235   LYMPHSABS 1,029 11/15/2018 1235   EOSABS 104 11/15/2018 1235   BASOSABS 30 11/15/2018 1235    No results found for: POCLITH, LITHIUM   No results found for: PHENYTOIN, PHENOBARB, VALPROATE, CBMZ   .res Assessment: Plan:    Romir was seen today for adhd and follow-up.  Diagnoses and all orders for this visit:  Attention deficit hyperactivity disorder (ADHD), combined type -     amphetamine-dextroamphetamine (ADDERALL) 30 MG tablet; Take 1 tablet by mouth 3 (three) times daily. -     amphetamine-dextroamphetamine (ADDERALL) 30 MG tablet; Take 1 tablet by mouth 3 (three) times daily. -     amphetamine-dextroamphetamine (ADDERALL) 30 MG tablet; Take 1 tablet by mouth 3 (three) times daily.  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  Excessive caffeine intake  Major depressive disorder, recurrent episode, moderate (HCC) -     escitalopram (LEXAPRO) 10 MG tablet; Take 1 tablet (10 mg total) by mouth daily.   Released from [facility] requirements.  Finished the Program.  Continue Lexapro 10 for some depressive symptoms and for a tendency to be obsessive which he thinks interferes with his overall productivity.  He has taken this medication in the past and has noticed benefit again this time..  As noted before we have examined him for bipolar symptoms repeatedly and not seen evidence for that.  However he is aware that if he has latent bipolar tendencies this could trigger them and if he has any unusual manic or hypomanic symptoms he will let us know.  Discussed side effects of SSRIs. Disc SSRI is not that great for obsessiveness.  So will not increase. He says threats from kids  to throw things away is the best motivator. Cont to work on getting rid of clutter.  For cost reasons he has stopped Vyvanse and switched to Adderall 30 mg 3 times daily.  He is aware this is a high dose but feels it is medically necessary.  His ADD has been severe and chronic and it is appropriate.  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  He had a recent physical exam with Dr. Duane Lope that was  unremarkable.  He is going to follow-up with his cardiologist as a matter of routine discussed the risk of between dosage sedation if he is not careful to space them out.  He is aware.  Discussed potential benefits, risks, and side effects of stimulants with patient to include increased heart rate, palpitations, insomnia, increased anxiety, increased irritability, or decreased appetite.  Instructed patient to contact office if experiencing any significant tolerability issues.  L-methylfolate was too expensive.  He could consider it again using good Rx but that is not convenient to him.  Continue lithium 300 mg daily.  No med changes today  He plans to continue vitamin D.  He is continuing counseling with Dr. Laurell Roof  Follow-up 6 months  Iona Hansen, MD, DFAPA   Please see After Visit Summary for patient specific instructions.  Future Appointments  Date Time Provider Department Center  12/19/2019  9:00 AM Robley Fries, PhD [facility] None    No orders of the defined types were placed in this encounter.     -------------------------------

## 2019-12-19 ENCOUNTER — Ambulatory Visit (INDEPENDENT_AMBULATORY_CARE_PROVIDER_SITE_OTHER): Payer: Medicare HMO | Admitting: Psychiatry

## 2019-12-19 DIAGNOSIS — F423 Hoarding disorder: Secondary | ICD-10-CM

## 2019-12-19 DIAGNOSIS — F330 Major depressive disorder, recurrent, mild: Secondary | ICD-10-CM

## 2019-12-19 DIAGNOSIS — F902 Attention-deficit hyperactivity disorder, combined type: Secondary | ICD-10-CM | POA: Diagnosis not present

## 2019-12-19 DIAGNOSIS — Z62820 Parent-biological child conflict: Secondary | ICD-10-CM | POA: Diagnosis not present

## 2019-12-19 DIAGNOSIS — R69 Illness, unspecified: Secondary | ICD-10-CM | POA: Diagnosis not present

## 2019-12-19 NOTE — Progress Notes (Signed)
Psychotherapy Progress Note [facility] Marliss Czar, PhD LP  Patient ID: Justin Marshall     MRN: [id number] Therapy format: Individual psychotherapy Date: 12/19/2019      Start: 9:14a     Stop: 10:02a     Time Spent: 48 min Location: Telehealth visit -- I connected with this patient by an approved telecommunication method (video), with his informed consent, and verifying identity and patient privacy.  I was located at my office and patient at his home.  As needed, we discussed the limitations, risks, and security and privacy concerns associated with telehealth service, including the availability and conditions which currently govern in-person appointments and the possibility that 3rd-party payment may not be fully guaranteed and he may be responsible for charges.  After he indicated understanding, we proceeded with the session.  Also discussed treatment planning, as needed, including ongoing verbal agreement with the plan, the opportunity to ask and answer all questions, his demonstrated understanding of instructions, and his readiness to call the office should symptoms worsen or he feels he is in a crisis state and needs more immediate and tangible assistance.   Video quality lost at times on PT's end due to bandwidth issues.  Converted to phone call.    Session narrative (presenting needs, interim history, self-report of stressors and symptoms, applications of prior therapy, status changes, and interventions made in session) Has gotten haircut and trimmed beard.  D [name] has been on his case about learning to let go of things.  Knows he gets hooked a lot by the idea that things could be useful later.  Compulsively looks in other people's black trash bags, knowing there might be something of value in them.  Historic moment when children were smaller and he was taking trash to the dump, found his stethoscope and one of the kids' shoes.  Has found a pile of old medical records stuck  under items downstairs at the house.  Working hard on that.  [name] has been trying to push him through decluttering and establishing a routine way of processing trash and recycling, trying to get him to take pictures and show her evidence of decluttering, like trash bags taken to the curb.  Pt has been showing pictures of other things.  Taking things out, just hard to part.  Advocated the simplicity of "just showing up" to decluttering work and getting decisions made, seeking the satisfaction of ending any number of dilemmas and piling up experience working through burdens.  Suggested he can point his daughters to one thing when they are worried and aggravated what to do for/with him -- "I know you worry about me.  You're never wrong reminding me it's worth the pain making decisions and letting go."  Affirmed any exercise he gets working through things, any experience.  Discussed the possibility of hiring help -- an Dance movement psychotherapist, a Patent examiner, or someone else who could come to his house, as suggested by his daughters.  Very reluctant, almost a fighting attitude  Offered to email logs and/or pictures to [place] to create more frequent accountability and momentum.  Therapeutic modalities: Cognitive Behavioral Therapy and Solution-Oriented/Positive Psychology  Mental Status/Observations:  Appearance:   Casual     Behavior:  Appropriate  Motor:  Normal  Speech/Language:   Clear and Coherent  Affect:  Appropriate  Mood:  anxious and dysthymic  Thought process:  normal  Thought content:    WNL  Sensory/Perceptual disturbances:    WNL  Orientation:  Fully  oriented  Attention:  Good    Concentration:  Fair  Memory:  WNL  Insight:    Good  Judgment:   Fair  Impulse Control:  Fair   Risk Assessment: Danger to Self: No Self-injurious Behavior: No Danger to Others: No Physical Aggression / Violence: No Duty to Warn: No Access to Firearms a concern: No  Assessment of progress:   stabilized  Diagnosis:   ICD-10-CM   1. 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 Attention deficit hyperactivity disorder (ADHD), combined type  F90.2   3. 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 Relationship problem between parent and child  Z62.820    Plan:  . Recommit to defined time and defined purpose decluttering  . Option to report decluttering achievements more often to [facility] or daughters -- if so, emphasize achievements, matter-of-factly, not shame, guilt, or sense of inadequacy, only count successes and make short work of defensive feelings . Encourage look back in to ADHD workbook obtained last year . Other recommendations/advice as may be noted above . Continue to utilize previously learned skills ad lib . Maintain medication as prescribed and work faithfully with relevant prescriber(s) if any changes are desired or seem indicated . Call the clinic on-call service, present to ER, or call 911 if any life-threatening psychiatric crisis Return in about 1 month (around 01/18/2020). . Already scheduled visit in this office Visit date not found.  Blanchie Serve, PhD Luan Moore, PhD LP Clinical Psychologist, [facility]  [facility] [address] [place], [place] [address] [phone]

## 2020-02-18 ENCOUNTER — Other Ambulatory Visit: Payer: Self-pay

## 2020-02-18 ENCOUNTER — Ambulatory Visit (INDEPENDENT_AMBULATORY_CARE_PROVIDER_SITE_OTHER): Payer: Medicare HMO | Admitting: Psychiatry

## 2020-02-18 DIAGNOSIS — Z789 Other specified health status: Secondary | ICD-10-CM | POA: Diagnosis not present

## 2020-02-18 DIAGNOSIS — R69 Illness, unspecified: Secondary | ICD-10-CM | POA: Diagnosis not present

## 2020-02-18 DIAGNOSIS — F411 Generalized anxiety disorder: Secondary | ICD-10-CM | POA: Diagnosis not present

## 2020-02-18 DIAGNOSIS — F330 Major depressive disorder, recurrent, mild: Secondary | ICD-10-CM

## 2020-02-18 DIAGNOSIS — F902 Attention-deficit hyperactivity disorder, combined type: Secondary | ICD-10-CM | POA: Diagnosis not present

## 2020-02-18 DIAGNOSIS — F423 Hoarding disorder: Secondary | ICD-10-CM

## 2020-02-18 DIAGNOSIS — Z62820 Parent-biological child conflict: Secondary | ICD-10-CM | POA: Diagnosis not present

## 2020-02-18 NOTE — Progress Notes (Signed)
Psychotherapy Progress Note [facility] Marliss Czar, PhD LP  Patient ID: Justin Marshall     MRN: [id number] Therapy format: Individual psychotherapy Date: 02/18/2020      Start: 4:17p     Stop: 5:05p     Time Spent: 48 min Location: In-person   Session narrative (presenting needs, interim history, self-report of stressors and symptoms, applications of prior therapy, status changes, and interventions made in session) Daughters gathered a few months ago, helped clean up some, pulled out a nonfunctioning dishwasher, and got him a new one for Father's Day, successfully talking him out of trying to install it himself.  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  Owes about $125K, tax value low 400s, fair market value probably mid-400s, some concern that he may give it away too cheap.  Owes $230K on his home.  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  Was thinking he might let it go for 500K, then thought maybe he should go for 600K, then his workman friend MJ left a note not to take anything less than 700K.  Extended discussion of principles, settled on opening $700K, settling for 550K if needed, and making sure whatever number he settles on can be some version of "good news" (low end did the town a favor, high end windfall with which he can settle debts, contribute to charity, or pay for help clearing other clutter).  Therapeutic modalities: Cognitive Behavioral Therapy and Solution-Oriented/Positive Psychology  Mental Status/Observations:  Appearance:   Casual, Neat and mildly eccentric     Behavior:  Rationalizing  Motor:  Normal  Speech/Language:   Clear and Coherent  Affect:  Appropriate  Mood:  anxious  and dysthymic  Thought process:  circumstantial  Thought content:    WNL  Sensory/Perceptual disturbances:    WNL  Orientation:  Fully oriented  Attention:  Good    Concentration:  Fair  Memory:  WNL  Insight:    Good  Judgment:   Fair  Impulse Control:  Fair   Risk Assessment: Danger to Self: No Self-injurious Behavior: No Danger to Others: No Physical Aggression / Violence: No Duty to Warn: No Access to Firearms a concern: No  Assessment of progress:  stabilized  Diagnosis:   ICD-10-CM   1. 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 Attention deficit hyperactivity disorder (ADHD), combined type  F90.2   3. Relationship problem between parent and child  Z62.820   4. 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 Generalized anxiety disorder  F41.1   6. Excessive caffeine intake  Z78.9    Plan:  . Continue decluttering, in short bursts or long, as long as he can dig in and focus -- 15 min minimum, OK to allow attention to something else after 15-min commitment . Win-win thinking about pricing the office . Reduce caffeine -- too many complications -- and confer with Dr. Jennelle Human if need stimulant modification.  Work sleep pattern as able. . Other recommendations/advice as may be noted above . Continue to utilize previously learned skills ad lib . Maintain medication as prescribed and work faithfully with relevant prescriber(s) if any changes are desired or seem indicated . Call  the clinic on-call service, present to ER, or call 911 if any life-threatening psychiatric crisis Return in about 1 month (around 03/19/2020). . Already scheduled visit in this office Visit date not found.  Blanchie Serve, PhD Luan Moore, PhD LP Clinical Psychologist, [facility] [address] [place], [place] [address] [phone]

## 2020-02-21 DIAGNOSIS — H04123 Dry eye syndrome of bilateral lacrimal glands: Secondary | ICD-10-CM | POA: Diagnosis not present

## 2020-02-21 DIAGNOSIS — H43813 Vitreous degeneration, bilateral: Secondary | ICD-10-CM | POA: Diagnosis not present

## 2020-02-21 DIAGNOSIS — H353131 Nonexudative age-related macular degeneration, bilateral, early dry stage: Secondary | ICD-10-CM | POA: Diagnosis not present

## 2020-02-21 DIAGNOSIS — Z961 Presence of intraocular lens: Secondary | ICD-10-CM | POA: Diagnosis not present

## 2020-03-24 ENCOUNTER — Ambulatory Visit (INDEPENDENT_AMBULATORY_CARE_PROVIDER_SITE_OTHER): Payer: Medicare HMO | Admitting: Psychiatry

## 2020-03-24 DIAGNOSIS — Z789 Other specified health status: Secondary | ICD-10-CM

## 2020-03-24 DIAGNOSIS — F902 Attention-deficit hyperactivity disorder, combined type: Secondary | ICD-10-CM

## 2020-03-24 DIAGNOSIS — Z62820 Parent-biological child conflict: Secondary | ICD-10-CM | POA: Diagnosis not present

## 2020-03-24 DIAGNOSIS — F423 Hoarding disorder: Secondary | ICD-10-CM

## 2020-03-24 DIAGNOSIS — F330 Major depressive disorder, recurrent, mild: Secondary | ICD-10-CM

## 2020-03-24 DIAGNOSIS — R69 Illness, unspecified: Secondary | ICD-10-CM | POA: Diagnosis not present

## 2020-03-24 NOTE — Progress Notes (Signed)
Psychotherapy Progress Note [facility] Marliss Czar, PhD LP  Patient ID: Justin Marshall     MRN: [id number] Therapy format: Individual psychotherapy Date: 03/24/2020      Start: 4:17p     Stop: 5:03p     Time Spent: 46 min Location: Telehealth visit -- I connected with this patient by an approved telecommunication method (audio only), with his informed consent, and verifying identity and patient privacy.  I was located at my office and patient at his home.  As needed, we discussed the limitations, risks, and security and privacy concerns associated with telehealth service, including the availability and conditions which currently govern in-person appointments and the possibility that 3rd-party payment may not be fully guaranteed and he may be responsible for charges.  After he indicated understanding, we proceeded with the session.  Also discussed treatment planning, as needed, including ongoing verbal agreement with the plan, the opportunity to ask and answer all questions, his demonstrated understanding of instructions, and his readiness to call the office should symptoms worsen or he feels he is in a crisis state and needs more immediate and tangible assistance.   Session narrative (presenting needs, interim history, self-report of stressors and symptoms, applications of prior therapy, status changes, and interventions made in session) Switched to telehealth earlier.  Feeling like a failure lately re. his self-improvement project.  Coming up on 3 years since he went to [place] with his daughters, on their invitation, and remembers feeling relief that he could make progress paying off debts and cleaning up.  [name] (4th daughter, PhD psychology) getting married in about 6 weeks, can't find a special picture he's looking for.  Harvie Bridge birthday last week, and she is pretty docile lately.  Had her spend the weekend, actually.  [name] (working on PhD in [employer] in [place])  has been caustic about the wedding and about her mother seeming to play favorites, but also with Pt about how he dealt with the idea to appraise the office in [place].  Signed off for a brief time but back in touch.  Vertell Limber says that when he first came to [facility], he was feeling lonely and wishing daughters would come visit him; now, with some trips in to clean up, is once in a while wishing they didn't come as much  as they have, b/c it's fraught with tension cleaning up things at the house and office.  Mulling over whether to more completely retire.  Doesn't feel that satisfied with his current practice, at the largely Spanish-speaking clinic in [place].    Re home maintenance, has worked through [employer], and gathered a lot of back tax information for his tax preparer (ex-W Michelle's husband Orvilla Fus).  Can declare victory to the extent that he can declare the amount of information he recovered is "good enough" to file his late taxes.  Did feel relief handing over.    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  Currently owes $109K on the office, about $305K on the home, and even if it sells for tax value, should cover both.  Very sentimental to him, and [name] had a good idea that they should hold a last party there when the time comes.  Affirmed and encouraged in working through his cleaning out.  Re-emphasized making 15-minute commitments to do one thing intended   Renewed appeal to reduce caffeine.  Had  an evening recently where he had some soda after supper and was unable to sleep for a while.   Therapeutic modalities: Cognitive Behavioral Therapy and Solution-Oriented/Positive Psychology  Mental Status/Observations:  Appearance:   Not assessed     Behavior:  Appropriate  Motor:  Not assessed  Speech/Language:   fluent  Affect:  Not assessed  Mood:  dysthymic  Thought process:  less tangential   Thought content:    WNL  Sensory/Perceptual disturbances:    WNL  Orientation:  Fully oriented  Attention:  Good    Concentration:  Fair  Memory:  WNL  Insight:    Good  Judgment:   Fair  Impulse Control:  Fair   Risk Assessment: Danger to Self: No Self-injurious Behavior: No Danger to Others: No Physical Aggression / Violence: No Duty to Warn: No Access to Firearms a concern: No  Assessment of progress:  progressing  Diagnosis:   ICD-10-CM   1. Attention deficit hyperactivity disorder (ADHD), combined type  F90.2   2. 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 Relationship problem between parent and child  Z62.820   4. 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 Excessive caffeine intake  Z78.9    Plan:  . Continue decluttering, shoot for 15-minute, single-task, no-deviating commitments . Reduce caffeine as able . See through the office sale as available, option to hold a party or commemoration before releasing it . Other recommendations/advice as may be noted above . Continue to utilize previously learned skills ad lib . Maintain medication as prescribed and work faithfully with relevant prescriber(s) if any changes are desired or seem indicated . Call the clinic on-call service, present to ER, or call 911 if any life-threatening psychiatric crisis Return in about 1 month (around 04/24/2020) for time at discretion, available earlier @ PT's need. . Already scheduled visit in this office Visit date not found.  Robley Fries, PhD Marliss Czar, PhD LP Clinical Psychologist, [facility] [address] [place], [place] [address] [phone]

## 2020-04-04 ENCOUNTER — Telehealth: Payer: Self-pay | Admitting: Psychiatry

## 2020-04-04 ENCOUNTER — Other Ambulatory Visit: Payer: Self-pay

## 2020-04-04 DIAGNOSIS — F902 Attention-deficit hyperactivity disorder, combined type: Secondary | ICD-10-CM

## 2020-04-04 MED ORDER — AMPHETAMINE-DEXTROAMPHETAMINE 30 MG PO TABS: 30.0000 mg | ORAL_TABLET | Freq: Three times a day (TID) | ORAL | 0 refills | Status: DC

## 2020-04-04 NOTE — Telephone Encounter (Signed)
Last refill 03/05/20, last apt 11/2019 Patient called back later in afternoon stating he's going out of town and needs it.  Front office aware to remind him providers need a 24-48 hour time frame to get refills sent.  RX is pended for Melony Overly to send today.

## 2020-04-04 NOTE — Telephone Encounter (Signed)
Pt called requesting refill for Adderall @ [facility]

## 2020-04-28 ENCOUNTER — Other Ambulatory Visit: Payer: Self-pay | Admitting: Psychiatry

## 2020-04-28 NOTE — Telephone Encounter (Signed)
Please send refill if appropriate

## 2020-05-06 ENCOUNTER — Telehealth: Payer: Self-pay | Admitting: Psychiatry

## 2020-05-06 NOTE — Telephone Encounter (Signed)
Justin Marshall called to request refill of his Adderall.  Made appt for 11/1.  [facility] [place].

## 2020-05-07 ENCOUNTER — Other Ambulatory Visit: Payer: Self-pay | Admitting: Psychiatry

## 2020-05-07 DIAGNOSIS — F902 Attention-deficit hyperactivity disorder, combined type: Secondary | ICD-10-CM

## 2020-05-07 MED ORDER — AMPHETAMINE-DEXTROAMPHETAMINE 30 MG PO TABS: 30.0000 mg | ORAL_TABLET | Freq: Three times a day (TID) | ORAL | 0 refills | Status: DC

## 2020-06-11 ENCOUNTER — Ambulatory Visit (INDEPENDENT_AMBULATORY_CARE_PROVIDER_SITE_OTHER): Payer: Medicare HMO | Admitting: Psychiatry

## 2020-06-11 DIAGNOSIS — Z789 Other specified health status: Secondary | ICD-10-CM

## 2020-06-11 DIAGNOSIS — F330 Major depressive disorder, recurrent, mild: Secondary | ICD-10-CM | POA: Diagnosis not present

## 2020-06-11 DIAGNOSIS — F423 Hoarding disorder: Secondary | ICD-10-CM | POA: Diagnosis not present

## 2020-06-11 DIAGNOSIS — F902 Attention-deficit hyperactivity disorder, combined type: Secondary | ICD-10-CM | POA: Diagnosis not present

## 2020-06-11 DIAGNOSIS — R69 Illness, unspecified: Secondary | ICD-10-CM | POA: Diagnosis not present

## 2020-06-11 DIAGNOSIS — F411 Generalized anxiety disorder: Secondary | ICD-10-CM | POA: Diagnosis not present

## 2020-06-11 NOTE — Progress Notes (Signed)
Psychotherapy Progress Note [facility] Marliss Czar, PhD LP  Patient ID: Justin Marshall     MRN: [id number] Therapy format: Individual psychotherapy Date: 06/11/2020      Start: 6:12p     Stop: 7:02p     Time Spent: 50 min Location: Telehealth visit -- I connected with this patient by an approved telecommunication method (video), with his informed consent, and verifying identity and patient privacy.  I was located at my office and patient at his car.  As needed, we discussed the limitations, risks, and security and privacy concerns associated with telehealth service, including the availability and conditions which currently govern in-person appointments and the possibility that 3rd-party payment may not be fully guaranteed and he may be responsible for charges.  After he indicated understanding, we proceeded with the session.  Also discussed treatment planning, as needed, including ongoing verbal agreement with the plan, the opportunity to ask and answer all questions, his demonstrated understanding of instructions, and his readiness to call the office should symptoms worsen or he feels he is in a crisis state and needs more immediate and tangible assistance.   Session narrative (presenting needs, interim history, self-report of stressors and symptoms, applications of prior therapy, status changes, and interventions made in session) Daughter got married, enjoyable time, validating message from her, was able to coexist with ex-wife fine.    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  Numb about it right now, closing date TBD before 11/30.  Challenging part will be finishing clearing out the place -- lots of records, 600 lbs of X-rays that could be recycled for silver, medical supplies that could be repurposed, some trash.  Discussed some resources and options for help processing the stuff, including  his daughters, estate auction company, a friend's personal offer.  History -- notes having grown up listening to his father bellyache, how Michele Mcalpine swore to make his own money, rely on himself more.  Has a book in his car -- Renita Papa, a copy his father came to possess sometime that had been a wedding gift somewhere in [place] -- brother is insisting he get it shipped back to him (agreed already it is, or can be, brother's) -- encouraged to go ahead and either send it or hand it off to [place] to run the errand, as already agreed, in respect of his brother.  Still entangled in a pseudorelationship with [name], still sexually active on a semiregular basis, keeps saying he needs to break up with her.  Supportively confronted failure to be direct with her.   Sister remains in a group home, semi-stable.  Not presently causing dilemmas.  Therapeutic modalities: Cognitive Behavioral Therapy, Solution-Oriented/Positive Psychology and Ego-Supportive  Mental Status/Observations:  Appearance:   Casual     Behavior:  Appropriate  Motor:  Normal  Speech/Language:   Clear and Coherent  Affect:  Appropriate  Mood:  anxious  Thought process:  normal  Thought content:    WNL  Sensory/Perceptual disturbances:    WNL  Orientation:  Fully oriented  Attention:  Good    Concentration:  Fair  Memory:  WNL  Insight:    Fair  Judgment:   Fair  Impulse Control:  Fair   Risk Assessment: Danger to Self: No Self-injurious Behavior: No Danger to Others: No Physical Aggression / Violence: No Duty to Warn: No Access to Firearms a concern: No  Assessment of progress:  progressing  Diagnosis:   ICD-10-CM   1. Attention deficit hyperactivity disorder (ADHD), combined type  F90.2   2. 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 Excessive caffeine intake  Z78.9   5. Generalized anxiety disorder  F41.1    Plan:    Engage partners of any kind in helping clean out medical office for sale  Go ahead and ship brother's book  Consider clarifying relationship with [name]  Try to reduce caffeine, back up last intake  Other recommendations/advice as may be noted above  Continue to utilize previously learned skills ad lib  Maintain medication as prescribed and work faithfully with relevant prescriber(s) if any changes are desired or seem indicated  Call the clinic on-call service, present to ER, or call 911 if any life-threatening psychiatric crisis Return in about 1 month (around 07/12/2020) for will call.  Already scheduled visit in this office 06/30/2020.  Robley Fries, PhD Marliss Czar, PhD LP Clinical Psychologist, [facility] [address] [place], [place] [address] [phone]

## 2020-06-30 ENCOUNTER — Telehealth (INDEPENDENT_AMBULATORY_CARE_PROVIDER_SITE_OTHER): Payer: Medicare HMO | Admitting: Psychiatry

## 2020-06-30 ENCOUNTER — Encounter: Payer: Self-pay | Admitting: Psychiatry

## 2020-06-30 DIAGNOSIS — F331 Major depressive disorder, recurrent, moderate: Secondary | ICD-10-CM | POA: Diagnosis not present

## 2020-06-30 DIAGNOSIS — F411 Generalized anxiety disorder: Secondary | ICD-10-CM

## 2020-06-30 DIAGNOSIS — F902 Attention-deficit hyperactivity disorder, combined type: Secondary | ICD-10-CM | POA: Diagnosis not present

## 2020-06-30 DIAGNOSIS — F330 Major depressive disorder, recurrent, mild: Secondary | ICD-10-CM | POA: Diagnosis not present

## 2020-06-30 DIAGNOSIS — F423 Hoarding disorder: Secondary | ICD-10-CM | POA: Diagnosis not present

## 2020-06-30 DIAGNOSIS — R69 Illness, unspecified: Secondary | ICD-10-CM | POA: Diagnosis not present

## 2020-06-30 MED ORDER — AMPHETAMINE-DEXTROAMPHETAMINE 30 MG PO TABS: 30.0000 mg | ORAL_TABLET | Freq: Three times a day (TID) | ORAL | 0 refills | Status: DC

## 2020-06-30 MED ORDER — LAMOTRIGINE 150 MG PO TABS: 150.0000 mg | ORAL_TABLET | Freq: Every day | ORAL | 1 refills | Status: DC

## 2020-06-30 MED ORDER — ESCITALOPRAM OXALATE 10 MG PO TABS: 10.0000 mg | ORAL_TABLET | Freq: Every day | ORAL | 0 refills | Status: DC

## 2020-06-30 MED ORDER — LITHIUM CARBONATE 300 MG PO CAPS: 300.0000 mg | ORAL_CAPSULE | Freq: Every day | ORAL | 3 refills | Status: DC

## 2020-06-30 NOTE — Progress Notes (Signed)
Justin Marshall [id number] [birth year] 69 y.o.  Virtual Visit via [facility]  I connected with pt by WebEx and verified that I am speaking with the correct person using two identifiers.   I discussed the limitations, risks, security and privacy concerns of performing an evaluation and management service by [facility] and the availability of in person appointments. I also discussed with the patient that there may be a patient responsible charge related to this service. The patient expressed understanding and agreed to proceed.  I discussed the assessment and treatment plan with the patient. The patient was provided an opportunity to ask questions and all were answered. The patient agreed with the plan and demonstrated an understanding of the instructions.   The patient was advised to call back or seek an in-person evaluation if the symptoms worsen or if the condition fails to improve as anticipated.  I provided 30 minutes of video time during this encounter. The call started at 430 to 500. The patient was located at home and the provider was located office.   Subjective:   Patient ID:  Justin Marshall is a 69 y.o. (DOB [birth year]) male.  Chief Complaint:  Chief Complaint  Patient presents with  . Follow-up    mood and meds  . ADHD    HPI Justin Marshall presents for follow-up of their ADD, history of anxiety and history of depression.  At visit Jan 10, 2019.  We initiated a return to Lexapro 20 mg for some depressive and obsessive symptoms.  He was alternating between Vyvanse and Adderall because of cost.  12/12/19 appt with following noted: Pretty good overall.  Good sleep.  L-methylfolate too expensive.  Iron level low.  Taking vitamin D usually. D got on his nerves decluttering him and throwing things away.     Gets distracted cleaning and straightening up.   Semi retired working 3 days a week. Feels better with less depression and less negative and less obsessive negative thinking.  His  clinic closed bc of Covid affecting the clinic.  Still struggles getting things done bc overwhelmed with demands.  May be a little anemic and that causes some tiredness.  B12 is normal.   Riding bike.  Tend to be depressed more than he wants to be.  Can be depressed and some obsessiveness and can't let go of things.   Plan: For cost reasons he has stopped Vyvanse and switched to Adderall 30 mg 3 times daily.   Continue Lexapro 10 and lithium 300 mg daily and lamotrigine 150. No med changes today.  06/30/2020 appointment with the following noted: No covid.  Vaccinated. Overall maintaining mood.  Lonely.  Youngest D PHD psychologist and just got married.  3rd D is still in school in PHD and causes some stress.  2nd D in [place] and has GD.  Oldest D NP with [facility].    Disc withdrawal with Adderall.  Still takes longer than he wants to do things.    No unusual mood swings.  Stays busy.  Patient reports stable mood and denies irritable moods.  No manic symptoms.  Patient denies any recent difficulty with anxiety.  Patient denies difficulty with sleep initiation or maintenance. 6-8 hours sleep.   Denies appetite disturbance.  Patient reports that  motivation have been good.  Patient still has problems with concentration.  Patient denies any suicidal ideation.  Past Psychiatric Medication Trials: Modafinil, Strattera tremor, Wellbutrin, Vyvanse, Ritalin, Aricept off label without benefit, Adzenys, Adderall 30  TID, lamotrigine,  Depakote tremor,  Lexapro 10  Review of Systems:  Review of Systems  Constitutional: Positive for fatigue.  Cardiovascular: Negative for chest pain and palpitations.  Neurological: Negative for weakness.  Psychiatric/Behavioral: Positive for decreased concentration.    Medications: I have reviewed the patient's current medications.  Current Outpatient Medications  Medication Sig Dispense Refill  . [START ON 08/25/2020] amphetamine-dextroamphetamine (ADDERALL)  30 MG tablet Take 1 tablet by mouth 3 (three) times daily. 90 tablet 0  . [START ON 07/14/2020] amphetamine-dextroamphetamine (ADDERALL) 30 MG tablet Take 1 tablet by mouth 3 (three) times daily. 90 tablet 0  . amphetamine-dextroamphetamine (ADDERALL) 30 MG tablet Take 1 tablet by mouth 3 (three) times daily. 90 tablet 0  . aspirin EC 81 MG tablet Take 81 mg by mouth. occas    . atorvastatin (LIPITOR) 10 MG tablet TAKE 1 TABLET BY MOUTH EVERY DAY 90 tablet 0  . Cholecalciferol (VITAMIN D3) 1.25 MG (50000 UT) CAPS Take 5,000 Units by mouth once a week.     . escitalopram (LEXAPRO) 10 MG tablet Take 1 tablet (10 mg total) by mouth daily. 180 tablet 0  . lamoTRIgine (LAMICTAL) 150 MG tablet Take 1 tablet (150 mg total) by mouth daily. 90 tablet 1  . lithium carbonate 300 MG capsule Take 1 capsule (300 mg total) by mouth daily. 90 capsule 3  . Multiple Vitamin (MULTIVITAMIN) capsule Take 1 capsule by mouth daily.    . naproxen sodium (ALEVE) 220 MG tablet Take 220 mg by mouth. Prn wrist pain    . Omega 3 1200 MG CAPS Take by mouth. 1-2 caps daily     No current facility-administered medications for this visit.    Medication Side Effects: None  Allergies: No Known Allergies  Past Medical History:  Diagnosis Date  . ADHD   . Anxiety and depression   . Vitamin D insufficiency     Family History  Problem Relation Age of Onset  . Heart failure Father   . Diabetes Father   . Diabetes Sister   . Bipolar disorder Sister   . Heart disease Brother   . Hepatitis C Brother   . Depression Brother     Social History   Socioeconomic History  . Marital status: Divorced    Spouse name: Not on file  . Number of children: 4  . Years of education: Not on file  . Highest education level: Not on file  Occupational History  . Not on file  Tobacco Use  . Smoking status: Never Smoker  . Smokeless tobacco: Never Used  Substance and Sexual Activity  . Alcohol use: Yes    Comment: occas  . Drug  use: Never  . Sexual activity: Not on file  Other Topics Concern  . Not on file  Social History Narrative  . Not on file   Social Determinants of Health   Financial Resource Strain:   . Difficulty of Paying Living Expenses: Not on file  Food Insecurity:   . Worried About Programme researcher, broadcasting/film/video in the Last Year: Not on file  . Ran Out of Food in the Last Year: Not on file  Transportation Needs:   . Lack of Transportation (Medical): Not on file  . Lack of Transportation (Non-Medical): Not on file  Physical Activity:   . Days of Exercise per Week: Not on file  . Minutes of Exercise per Session: Not on file  Stress:   . Feeling of Stress : Not on file  Social Connections:   . Frequency of Communication with Friends and Family: Not on file  . Frequency of Social Gatherings with Friends and Family: Not on file  . Attends Religious Services: Not on file  . Active Member of Clubs or Organizations: Not on file  . Attends Banker Meetings: Not on file  . Marital Status: Not on file  Intimate Partner Violence:   . Fear of Current or Ex-Partner: Not on file  . Emotionally Abused: Not on file  . Physically Abused: Not on file  . Sexually Abused: Not on file    Past Medical History, Surgical history, Social history, and Family history were reviewed and updated as appropriate.   Please see review of systems for further details on the patient's review from today.   Objective:   Physical Exam:  There were no vitals taken for this visit.  Physical Exam Neurological:     Mental Status: He is alert and oriented to person, place, and time.     Cranial Nerves: No dysarthria.  Psychiatric:        Attention and Perception: Attention normal.        Mood and Affect: Mood is not anxious or depressed.        Speech: Speech normal.        Behavior: Behavior is cooperative.        Thought Content: Thought content normal. Thought content is not paranoid or delusional. Thought content  does not include homicidal or suicidal ideation. Thought content does not include homicidal or suicidal plan.        Cognition and Memory: Cognition and memory normal.        Judgment: Judgment normal.     Comments: Insight good.       Lab Review:     Component Value Date/Time   NA 139 11/15/2018 1235   K 4.2 11/15/2018 1235   CL 104 11/15/2018 1235   CO2 26 11/15/2018 1235   GLUCOSE 117 (H) 11/15/2018 1235   BUN 22 11/15/2018 1235   CREATININE 1.10 11/15/2018 1235   CALCIUM 9.4 11/15/2018 1235   GFRNONAA 69 11/15/2018 1235   GFRAA 80 11/15/2018 1235       Component Value Date/Time   WBC 7.4 11/15/2018 1235   RBC 4.21 11/15/2018 1235   HGB 13.0 (L) 11/15/2018 1235   HCT 39.4 11/15/2018 1235   PLT 287 11/15/2018 1235   MCV 93.6 11/15/2018 1235   MCH 30.9 11/15/2018 1235   MCHC 33.0 11/15/2018 1235   RDW 13.2 11/15/2018 1235   LYMPHSABS 1,029 11/15/2018 1235   EOSABS 104 11/15/2018 1235   BASOSABS 30 11/15/2018 1235    No results found for: POCLITH, LITHIUM   No results found for: PHENYTOIN, PHENOBARB, VALPROATE, CBMZ   .res Assessment: Plan:    Michio was seen today for follow-up and adhd.  Diagnoses and all orders for this visit:  Mild recurrent major depression (HCC) -     escitalopram (LEXAPRO) 10 MG tablet; Take 1 tablet (10 mg total) by mouth daily. -     lamoTRIgine (LAMICTAL) 150 MG tablet; Take 1 tablet (150 mg total) by mouth daily. -     lithium carbonate 300 MG capsule; Take 1 capsule (300 mg total) by mouth daily.  Attention deficit hyperactivity disorder (ADHD), combined type -     amphetamine-dextroamphetamine (ADDERALL) 30 MG tablet; Take 1 tablet by mouth 3 (three) times daily. -     amphetamine-dextroamphetamine (ADDERALL) 30 MG tablet;  Take 1 tablet by mouth 3 (three) times daily. -     amphetamine-dextroamphetamine (ADDERALL) 30 MG tablet; Take 1 tablet by mouth 3 (three) times daily.  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  Major depressive disorder, recurrent episode, moderate (HCC)   Continue Lexapro 10 for some depressive symptoms and for a tendency to be obsessive which he thinks interferes with his overall productivity.  He has taken this medication in the past and has noticed benefit again this time..  As noted before we have examined him for bipolar symptoms repeatedly and not seen evidence for that.  However he is aware that if he has latent bipolar tendencies this could trigger them and if he has any unusual manic or hypomanic symptoms he will let us know.  Discussed side effects of SSRIs. Disc SSRI is not that great for obsessiveness.  So will not increase. He says threats from kids to throw things away is the best motivator. Cont to work on getting rid of clutter.  For cost reasons he has stopped Vyvanse and switched to Adderall 30 mg 3 times daily.  He is aware this is a high dose but feels it is medically necessary.  His ADD has been severe and chronic and it is appropriate.  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    He is going to follow-up with his cardiologist as a matter of routine discussed the risk of between dosage sedation if he is not careful to space them out.  He is aware.  Discussed potential benefits, risks, and side effects of stimulants with patient to include increased heart rate, palpitations, insomnia, increased anxiety, increased irritability, or decreased appetite.  Instructed patient to contact office if experiencing any significant tolerability issues.  L-methylfolate was too expensive.  He could consider it again using good Rx but that is not convenient to him.  Continue lithium 300 mg daily.  No med changes today  He plans to continue vitamin D.  He is continuing counseling with Dr. Laurell Roof. We discussed ADHD strategies.  Follow-up 6 months  Iona Hansen, MD, DFAPA   Please see After Visit Summary for patient specific instructions.  No future appointments.  No orders of the defined types were placed in this encounter.     -------------------------------

## 2020-07-04 ENCOUNTER — Other Ambulatory Visit: Payer: Self-pay

## 2020-07-04 ENCOUNTER — Other Ambulatory Visit: Payer: Self-pay | Admitting: Cardiology

## 2020-07-04 DIAGNOSIS — I700 Atherosclerosis of aorta: Secondary | ICD-10-CM

## 2020-07-04 MED ORDER — ATORVASTATIN CALCIUM 10 MG PO TABS: 10.0000 mg | ORAL_TABLET | Freq: Every day | ORAL | 0 refills | Status: DC

## 2020-08-11 ENCOUNTER — Telehealth: Payer: Self-pay | Admitting: Cardiology

## 2020-08-13 NOTE — Telephone Encounter (Signed)
Appt made w/ pt for 09/03/19 at 4:15

## 2020-08-27 DIAGNOSIS — R69 Illness, unspecified: Secondary | ICD-10-CM | POA: Diagnosis not present

## 2020-09-02 ENCOUNTER — Other Ambulatory Visit: Payer: Self-pay | Admitting: Psychiatry

## 2020-09-02 ENCOUNTER — Telehealth: Payer: Self-pay | Admitting: Psychiatry

## 2020-09-02 ENCOUNTER — Ambulatory Visit: Payer: Medicare HMO | Admitting: Cardiology

## 2020-09-02 DIAGNOSIS — F902 Attention-deficit hyperactivity disorder, combined type: Secondary | ICD-10-CM

## 2020-09-02 DIAGNOSIS — F330 Major depressive disorder, recurrent, mild: Secondary | ICD-10-CM

## 2020-09-02 DIAGNOSIS — F411 Generalized anxiety disorder: Secondary | ICD-10-CM

## 2020-09-02 MED ORDER — AMPHETAMINE-DEXTROAMPHETAMINE 30 MG PO TABS: 30.0000 mg | ORAL_TABLET | Freq: Three times a day (TID) | ORAL | 0 refills | Status: DC

## 2020-09-02 MED ORDER — ESCITALOPRAM OXALATE 10 MG PO TABS: 10.0000 mg | ORAL_TABLET | Freq: Every day | ORAL | 1 refills | Status: DC

## 2020-09-02 NOTE — Telephone Encounter (Signed)
Pt called to request refill for Lexapro to [facility] and Adderall for Jan, Feb & Mar to [facility]

## 2020-09-04 ENCOUNTER — Encounter: Payer: Self-pay | Admitting: Cardiology

## 2020-09-04 ENCOUNTER — Other Ambulatory Visit: Payer: Self-pay

## 2020-09-04 ENCOUNTER — Ambulatory Visit: Payer: Medicare HMO | Admitting: Cardiology

## 2020-09-04 VITALS — BP 129/84 | HR 89 | Resp 16 | Ht 68.0 in | Wt 159.6 lb

## 2020-09-04 DIAGNOSIS — I700 Atherosclerosis of aorta: Secondary | ICD-10-CM

## 2020-09-04 DIAGNOSIS — R0609 Other forms of dyspnea: Secondary | ICD-10-CM

## 2020-09-04 DIAGNOSIS — Z9889 Other specified postprocedural states: Secondary | ICD-10-CM | POA: Diagnosis not present

## 2020-09-04 DIAGNOSIS — R0600 Dyspnea, unspecified: Secondary | ICD-10-CM

## 2020-09-04 NOTE — Progress Notes (Signed)
Primary Physician/Referring:  Lawerance Cruel, MD  Patient ID: Justin Marshall, male    DOB: 1951/04/26, 70 y.o.   MRN: [id number]  Chief Complaint  Patient presents with  . Follow-up   HPI:     Justin Marshall  is a 70 y.o. Caucasian male with mild hyperlipidemia, aortic atherosclerosis, h/o mitral valve repair for severe mitral regurgitation by Dr. Rosalyn Gess at [facility] in 2006. In 10/16/1998 he has had a repeat coronary angiography for shortness of breath and abnormal EKG and was found to have no significant coronary artery disease with a normal ejection fraction and no evidence of mitral regurgitation.   This is a two year visit. He has chronic dyspnea. Denies any chest pain, dizziness or palpitations.  No change in symptoms since I last saw him 2 years ago.  Past Medical History:  Diagnosis Date  . ADHD   . Anxiety and depression   . Vitamin D insufficiency    Past Surgical History:  Procedure Laterality Date  . MITRAL VALVE REPAIR  07/18/2005    Social History   Tobacco Use  . Smoking status: Never Smoker  . Smokeless tobacco: Never Used  Substance Use Topics  . Alcohol use: Yes    Alcohol/week: 7.0 standard drinks    Types: 7 Shots of liquor per week    Comment: occas   Marital Status: Divorced   ROS  Review of Systems  Constitutional: Negative for chills, decreased appetite, malaise/fatigue and weight gain.  Cardiovascular: Positive for dyspnea on exertion. Negative for leg swelling and syncope.  Endocrine: Negative for cold intolerance.  Hematologic/Lymphatic: Does not bruise/bleed easily.  Musculoskeletal: Negative for joint swelling.  Gastrointestinal: Positive for heartburn (occasional). Negative for abdominal pain, anorexia, change in bowel habit, hematochezia and melena.  Neurological: Negative for headaches and light-headedness.  Psychiatric/Behavioral: Negative for depression and substance abuse.  All other systems reviewed and are  negative.  Objective   Vitals with BMI 09/04/2020 05/28/2019 11/15/2018  Height $Remov'5\' 8"'TNiPgH$  $Remove'5\' 8"'CdOztXg$  $RemoveB'5\' 8"'rWvKUzjO$   Weight 159 lbs 10 oz 157 lbs 8 oz 154 lbs  BMI 24.27 29.93 71.69  Systolic 678 938 101  Diastolic 84 72 64  Pulse 89 69 99  Some encounter information is confidential and restricted. Go to Review Flowsheets activity to see all data.    Blood pressure 129/84, pulse 89, resp. rate 16, height $RemoveBe'5\' 8"'VGDAYZQqn$  (1.727 m), weight 159 lb 9.6 oz (72.4 kg), SpO2 92 %. Body mass index is 24.27 kg/m.   Physical Exam Constitutional:      General: He is not in acute distress.    Appearance: He is well-developed.  HENT:     Head: Atraumatic.  Eyes:     Conjunctiva/sclera: Conjunctivae normal.  Neck:     Thyroid: No thyromegaly.     Vascular: No JVD.  Cardiovascular:     Rate and Rhythm: Normal rate and regular rhythm.     Pulses: Intact distal pulses.     Heart sounds: Normal heart sounds. No murmur heard. No gallop.   Pulmonary:     Effort: Pulmonary effort is normal.     Breath sounds: Normal breath sounds.  Abdominal:     General: Bowel sounds are normal.     Palpations: Abdomen is soft.  Musculoskeletal:        General: Normal range of motion.     Cervical back: Neck supple.  Skin:    General: Skin is warm and dry.  Neurological:  Mental Status: He is alert.    Radiology: No results found.  Laboratory examination:   No results for input(s): NA, K, CL, CO2, GLUCOSE, BUN, CREATININE, CALCIUM, GFRNONAA, GFRAA in the last 8760 hours. CMP Latest Ref Rng & Units 11/15/2018  Glucose 65 - 99 mg/dL 335(Q)  BUN 7 - 25 mg/dL 22  Creatinine 1.00 - 2.98 mg/dL 0.37  Sodium 713 - 181 mmol/L 139  Potassium 3.5 - 5.3 mmol/L 4.2  Chloride 98 - 110 mmol/L 104  CO2 20 - 32 mmol/L 26  Calcium 8.6 - 10.3 mg/dL 9.4   CBC Latest Ref Rng & Units 11/15/2018  WBC 3.8 - 10.8 Thousand/uL 7.4  Hemoglobin 13.2 - 17.1 g/dL 13.0(L)  Hematocrit 38.5 - 50.0 % 39.4  Platelets 140 - 400 Thousand/uL 287    External Labs:  07/19/2019:  Total cholesterol 154, triglycerides 44, HDL 79, LDL 62.  Serum glucose 1 8 mg, BUN 20, creatinine 0.92, EGFR >60 mL.  No results found for: CHOL, TRIG, HDL, CHOLHDL, VLDL, LDLCALC, LDLDIRECT HEMOGLOBIN A1C Lab Results  Component Value Date   HGBA1C 5.7 (H) 11/15/2018   MPG 117 11/15/2018   TSH No results for input(s): TSH in the last 8760 hours. Medications  No Known Allergies   Current Outpatient Medications on File Prior to Visit  Medication Sig Dispense Refill  . amphetamine-dextroamphetamine (ADDERALL) 30 MG tablet Take 1 tablet by mouth 3 (three) times daily. 90 tablet 0  . aspirin EC 81 MG tablet Take 81 mg by mouth. occas    . atorvastatin (LIPITOR) 10 MG tablet Take 1 tablet (10 mg total) by mouth daily. PLEASE CALL OFFICE FOR FURTHER REFILLS 90 tablet 0  . Cholecalciferol (VITAMIN D3) 1.25 MG (50000 UT) CAPS Take 50,000 Units by mouth once a week.    . escitalopram (LEXAPRO) 10 MG tablet Take 1 tablet (10 mg total) by mouth daily. 180 tablet 1  . lamoTRIgine (LAMICTAL) 150 MG tablet Take 1 tablet (150 mg total) by mouth daily. 90 tablet 1  . lithium carbonate 300 MG capsule Take 1 capsule (300 mg total) by mouth daily. 90 capsule 3  . Multiple Vitamin (MULTIVITAMIN) capsule Take 1 capsule by mouth daily.    . naproxen sodium (ALEVE) 220 MG tablet Take 220 mg by mouth. Prn wrist pain    . Omega 3 1200 MG CAPS Take by mouth. 1-2 caps daily     No current facility-administered medications on file prior to visit.     Radiology   Chest x-ray PA and lateral view 11/15/2018: The heart size and mediastinal contours are within normal limits. Both lungs are clear. The visualized skeletal structures are  unremarkable. Wires suture and clips anterior right chest. Aortic atherosclerosis.  Cardiac Studies:   Echocardiogram 09/12/2017: Left ventricle cavity is normal in size. Normal global wall motion. Normal diastolic filling pattern.  Calculated EF 70%. Stable mitral valve repair.  Mild mitral regurgitation. No evidence of pulmonary hypertension. No significant change compared to prior study in 2015.  Treadmill exercise stress test 09/12/2017: Indication: Shortness of breath, H/O Mitral valve repair.  The patient exercised on Bruce protocol for 12:00 min. Patient achieved  11.17 METS and reached HR  167 bpm, which is  107 % of maximum age-predicted HR.  Stress test terminated due to fatigue.  Resting EKG demonstrates Normal sinus rhythm. ST Changes: With peak exercise there was no ST-T changes of ischemia. Arrhythmias: none. BP Response to Exercise: Normal resting BP- appropriate response. Exercise capacity was  above average for age. Continue primary/secondary prevention.  EKG:   EKG 09/04/2020: Normal sinus rhythm at rate of 73 bpm, normal axis, no evidence of ischemia, normal EKG.   EKG 05/20/2019: Normal sinus rhythm at rate of 68 bpm, normal axis, no evidence of ischemia, normal EKG.   Assessment     ICD-10-CM   1. Dyspnea on exertion  R06.00 EKG 12-Lead  2. Aortic atherosclerosis (HCC)  I70.0   3. 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 Rosalyn Gess at [facility] in 2006. In 10/16/1998 he has had a repeat coronary angiography for shortness of breath and abnormal EKG and was found to have no significant coronary artery disease with a normal ejection fraction and no evidence of mitral regurgitation.   Patient is here on a 2-year visit for follow-up of mitral valve repair.  He remains asymptomatic except for chronic dyspnea, no clinical evidence of heart failure, blood pressure is well controlled.  I reviewed his external labs, lipids are under good control although they are 18-year-old.  He has had recent labs and he will try to text that to me at  some point.  He is aware of endocarditis prophylaxis needed.  Aortic atherosclerosis is very mild, in view of good control of lipids no further evaluation is indicated.  He is also had normal coronary arteries by angiography in the past.   Adrian Prows, MD, [facility] 09/04/2020, 4:59 PM Office: [phone] Pager: [phone]

## 2020-09-15 ENCOUNTER — Ambulatory Visit (INDEPENDENT_AMBULATORY_CARE_PROVIDER_SITE_OTHER): Payer: Medicare HMO | Admitting: Psychiatry

## 2020-09-15 DIAGNOSIS — F330 Major depressive disorder, recurrent, mild: Secondary | ICD-10-CM

## 2020-09-15 DIAGNOSIS — F423 Hoarding disorder: Secondary | ICD-10-CM | POA: Diagnosis not present

## 2020-09-15 DIAGNOSIS — Z789 Other specified health status: Secondary | ICD-10-CM

## 2020-09-15 DIAGNOSIS — F902 Attention-deficit hyperactivity disorder, combined type: Secondary | ICD-10-CM

## 2020-09-15 DIAGNOSIS — F411 Generalized anxiety disorder: Secondary | ICD-10-CM

## 2020-09-15 DIAGNOSIS — R69 Illness, unspecified: Secondary | ICD-10-CM | POA: Diagnosis not present

## 2020-09-15 NOTE — Progress Notes (Signed)
Psychotherapy Progress Note [facility] Marliss Czar, PhD LP  Patient ID: Justin Marshall     MRN: [id number] Therapy format: Individual psychotherapy Date: 09/15/2020      Start: 4:38p     Stop: 5:16p     Time Spent: 38 min Location: Telehealth visit -- I connected with this patient by an approved telecommunication method (video), with his informed consent, and verifying identity and patient privacy.  I was located at my office and patient at his home.  As needed, we discussed the limitations, risks, and security and privacy concerns associated with telehealth service, including the availability and conditions which currently govern in-person appointments and the possibility that 3rd-party payment may not be fully guaranteed and he may be responsible for charges.  After he indicated understanding, we proceeded with the session.  Also discussed treatment planning, as needed, including ongoing verbal agreement with the plan, the opportunity to ask and answer all questions, his demonstrated understanding of instructions, and his readiness to call the office should symptoms worsen or he feels he is in a crisis state and needs more immediate and tangible assistance.   Session narrative (presenting needs, interim history, self-report of stressors and symptoms, applications of prior therapy, status changes, and interventions made in session) First appointment in a few months.  TX much delayed this session by two prior patients, but accepting.    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  Settled the lawsuit from [employer] over his former associate's fraudulent testing.  Has $300K to pay off the house, $27K settlement, about $20K in credit card debt, and capital gains taxes to set aside.  May want a Tesla, like his son-in-law, glad to know D [name] does not object, plus figures it could be self-reward for giving up his office and committing to clean it out.  Managed to table an issue with the septic  system and get away with not picking up responsibility for it.  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  Re. office clutter, has not been through the stuff but considering getting it loaded into PODs and moved to his house so he can still evaluate.  Credited with committing to the sale and parting with his (pipe) dream of reestablishing a practice in [place] despite plenty of sentimental attachment.  Still struggles mornings for staying up late.  Putting on music helps.    To-do list includes working through clutter at office and home.  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  Knows he gets distracted by sports, phone, and social media -- recommended setting dedicated social media time and dedicated phone-unavailable time to break up distractibility.  Also, for tracking time,   Has made tax attorney appointment, now has deadline pressure for finding tax records.    Following up on Anna's recommendation, he looked into [employer]'s program.  Contacted in August, and then a few weeks ago met with a IT consultant.  [name] not covered there, but it's a 16-week program that would be more skills-focused.  Agreed it would be appropriate.  Therapeutic modalities: Cognitive Behavioral Therapy and Solution-Oriented/Positive Psychology  Mental Status/Observations:  Appearance:   Casual     Behavior:  Appropriate  Motor:  Normal  Speech/Language:   Clear and Coherent  Affect:  Appropriate  Mood:  dysthymic  Thought process:  tangential  Thought content:    WNL  Sensory/Perceptual disturbances:    WNL  Orientation:  Fully oriented  Attention:  Good    Concentration:  Fair  Memory:  WNL  Insight:    Good  Judgment:   Fair  Impulse Control:  Fair   Risk Assessment: Danger to Self: No Self-injurious Behavior: No Danger to Others: No Physical Aggression  / Violence: No Duty to Warn: No Access to Firearms a concern: No  Assessment of progress:  progressing  Diagnosis:   ICD-10-CM   1. Attention deficit hyperactivity disorder (ADHD), combined type  F90.2   2. 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 Generalized anxiety disorder  F41.1   5. Excessive caffeine intake  Z78.9    Plan:  . Continue work plowing through clutter, wherever possible.  Emphasize short commitments, restricted objectives, and staying on task . Schedule social media and entertainment times in order to minimize self-interruptions . Try to reduce caffeine habit after 2pm and knock off for the day well before midnight . Option to enroll in structured adult ADHD program through [facility] . Other recommendations/advice as may be noted above . Continue to utilize previously learned skills ad lib . Maintain medication as prescribed and work faithfully with relevant prescriber(s) if any changes are desired or seem indicated . Call the clinic on-call service, present to ER, or call 911 if any life-threatening psychiatric crisis Return in about 1 month (around 10/16/2020) for will call. . Already scheduled visit in this office Visit date not found.  Blanchie Serve, PhD Luan Moore, PhD LP Clinical Psychologist, [facility] [address], [place], [place] [address] [phone]

## 2020-09-26 ENCOUNTER — Other Ambulatory Visit: Payer: Self-pay | Admitting: Cardiology

## 2020-09-26 DIAGNOSIS — I700 Atherosclerosis of aorta: Secondary | ICD-10-CM

## 2020-11-10 ENCOUNTER — Ambulatory Visit (INDEPENDENT_AMBULATORY_CARE_PROVIDER_SITE_OTHER): Payer: Medicare HMO | Admitting: Psychiatry

## 2020-11-10 DIAGNOSIS — F330 Major depressive disorder, recurrent, mild: Secondary | ICD-10-CM | POA: Diagnosis not present

## 2020-11-10 DIAGNOSIS — F902 Attention-deficit hyperactivity disorder, combined type: Secondary | ICD-10-CM | POA: Diagnosis not present

## 2020-11-10 DIAGNOSIS — Z789 Other specified health status: Secondary | ICD-10-CM | POA: Diagnosis not present

## 2020-11-10 DIAGNOSIS — R69 Illness, unspecified: Secondary | ICD-10-CM | POA: Diagnosis not present

## 2020-11-10 DIAGNOSIS — F423 Hoarding disorder: Secondary | ICD-10-CM | POA: Diagnosis not present

## 2020-11-10 NOTE — Progress Notes (Signed)
Psychotherapy Progress Note [facility] Marliss Czar, PhD LP  Patient ID: Justin Marshall     MRN: [id number] Therapy format: Individual psychotherapy Date: 11/10/2020      Start: 4:14p     Stop: 4:54p     Time Spent: 40 min Location: Telehealth visit -- I connected with this patient by an approved telecommunication method (video), with his informed consent, and verifying identity and patient privacy.  I was located at my office and patient at his home.  As needed, we discussed the limitations, risks, and security and privacy concerns associated with telehealth service, including the availability and conditions which currently govern in-person appointments and the possibility that 3rd-party payment may not be fully guaranteed and he may be responsible for charges.  After he indicated understanding, we proceeded with the session.  Also discussed treatment planning, as needed, including ongoing verbal agreement with the plan, the opportunity to ask and answer all questions, his demonstrated understanding of instructions, and his readiness to call the office should symptoms worsen or he feels he is in a crisis state and needs more immediate and tangible assistance.   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  Did an 11th-hour clearout with two daughters' help.  Retrieved patient charts, burned some, has some in boxes in his RV, some at home.  For the most part, just need shredding, but from time to time a sentimental attachment comes up.  Accepted forfeiting to the city what he could not take care of himself.  Affirmed letting that happen and surrendering to others.  Still working on taxes, in arrears  several years now.  Continued to coach in making short periods of time to dedicatedly declutter with outside tracking.  Daughter [name] finished her PhD recently.  Proud of her.  Got a strengths test readout given to him about collecting things, shared in session.  Basically a positive spin on hoarding.  Argued recently with daughter [name] about letting go some items in the house and about her spiriting out electronics beyond what he had agreed to.  Sister Elease Hashimoto to the house yesterday, first time in a long time.    Re. caffeine, has lost taste for coffee, maybe 3 cc per day.  Trying to ratchet down sodas, trying to keep caffeine curfew to 6pm.  Has [employer], intermittently used.    Therapeutic modalities: Cognitive Behavioral Therapy, Solution-Oriented/Positive Psychology and Ego-Supportive  Mental Status/Observations:  Appearance:   Casual     Behavior:  Appropriate and some playful evasion  Motor:  Normal  Speech/Language:   Clear and Coherent  Affect:  Appropriate  Mood:  dysthymic  Thought process:  normal  Thought content:    WNL  Sensory/Perceptual disturbances:    WNL  Orientation:  Fully oriented  Attention:  Good    Concentration:  Fair  Memory:  WNL  Insight:    Good  Judgment:   Fair  Impulse Control:  Fair   Risk Assessment: Danger to Self: No Self-injurious Behavior: No Danger to Others: No Physical Aggression / Violence: No Duty to Warn: No Access to Firearms a concern: No  Assessment of progress:  progressing  Diagnosis:   ICD-10-CM   1. Hoarding disorder with excessive acquisition and with  good or fair insight  F42.3   2. Attention deficit hyperactivity disorder (ADHD), combined type  F90.2   3. 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 Excessive caffeine intake  Z78.9    Plan:  . Break early with intention to go apply himself to a cleanup task, as practice in seizing the moment . Continue any cleanup efforts for which he is motivated,  with continuing emphasis on "sprints" -- relatively short periods of time absolutely focused on one objective and unwilling to sidetrack for any reason . Noticing coach himself to allow others to dispose of things whenever possible . Continue weaning caffeine as able . Other recommendations/advice as may be noted above . Continue to utilize previously learned skills ad lib . Maintain medication as prescribed and work faithfully with relevant prescriber(s) if any changes are desired or seem indicated . Call the clinic on-call service, present to ER, or call 911 if any life-threatening psychiatric crisis Return in about 1 month (around 12/11/2020) for will call. . Already scheduled visit in this office Visit date not found.  Robley Fries, PhD Marliss Czar, PhD LP Clinical Psychologist, [facility] [address] [place], [place] [address] [phone]

## 2020-12-02 ENCOUNTER — Ambulatory Visit (INDEPENDENT_AMBULATORY_CARE_PROVIDER_SITE_OTHER): Payer: Medicare HMO | Admitting: Psychiatry

## 2020-12-02 DIAGNOSIS — R69 Illness, unspecified: Secondary | ICD-10-CM | POA: Diagnosis not present

## 2020-12-02 DIAGNOSIS — F330 Major depressive disorder, recurrent, mild: Secondary | ICD-10-CM

## 2020-12-02 DIAGNOSIS — F423 Hoarding disorder: Secondary | ICD-10-CM

## 2020-12-02 DIAGNOSIS — Z789 Other specified health status: Secondary | ICD-10-CM | POA: Diagnosis not present

## 2020-12-02 DIAGNOSIS — F902 Attention-deficit hyperactivity disorder, combined type: Secondary | ICD-10-CM

## 2020-12-02 NOTE — Progress Notes (Signed)
Psychotherapy Progress Note [facility] Marliss Czar, PhD LP  Patient ID: Justin Marshall     MRN: [id number] Therapy format: Individual psychotherapy Date: 12/02/2020      Start: 3:10p     Stop: 3:50p     Time Spent: 40 min Location: Telehealth visit -- I connected with this patient by an approved telecommunication method (audio only), with his informed consent, and verifying identity and patient privacy.  I was located at my office and patient at his home.  As needed, we discussed the limitations, risks, and security and privacy concerns associated with telehealth service, including the availability and conditions which currently govern in-person appointments and the possibility that 3rd-party payment may not be fully guaranteed and he may be responsible for charges.  After he indicated understanding, we proceeded with the session.  Also discussed treatment planning, as needed, including ongoing verbal agreement with the plan, the opportunity to ask and answer all questions, his demonstrated understanding of instructions, and his readiness to call the office should symptoms worsen or he feels he is in a crisis state and needs more immediate and tangible assistance.   Session narrative (presenting needs, interim history, self-report of stressors and symptoms, applications of prior therapy, status changes, and interventions made in session) Scheduled yesterday.  Continues to try to work with decluttering.  Elease Hashimoto has been in touch to say her group home will be converted to senior living and she'll need a place to live.  Trying not to get drawn in.  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    Himself, noticing some clearer and foggier days.  Found a journal at home started using it and his iPad or phone, for to-do list and journaling.  Still c/o starting one  thing, seeing another, starting it, too, and looking around telling himself things he "needs to" do.  Does feel he is making incremental progress putting the effort into more important tasks.  Listing may help triage.  Encouraged in using medical thinking from the emergency room -- triage  -- and still dedicating to press ahead on the task he came for rather than sidetracking.  Still dealing with self-shaming, been back in ruminating about past work and Secretary/administrator office.  Items taken from the medical office are strewn about at home, finding they remind him often of lost plans and dreams.  Support/empathy provided, encouraged in completing the act letting go of things that are useless or risky to hold onto.  Addressed paper charts -- fending off sidetracking conversations -- encouraged he make those a high priority, going through them with an eye toward only (1) keeping the ones that he is willing to give to the patient and (2) seeing through disposal of the rest.  Otherwise, encouraged again to see through a task before switching to another.  Therapeutic modalities: Cognitive Behavioral Therapy and Solution-Oriented/Positive Psychology  Mental Status/Observations:  Appearance:   Casual     Behavior:  sidetracking  Motor:  Normal  Speech/Language:   Clear and Coherent  Affect:  Appropriate  Mood:  dysthymic  Thought process:  normal and some tangent-taking  Thought content:    WNL  Sensory/Perceptual disturbances:    WNL  Orientation:  Fully oriented  Attention:  Good    Concentration:  Fair  Memory:  WNL  Insight:    Good  Judgment:   Fair  Impulse Control:  Fair   Risk Assessment: Danger to Self: No Self-injurious Behavior: No Danger to Others: No Physical Aggression / Violence: No Duty to Warn: No Access to Firearms a concern: No  Assessment of progress:  progressing  Diagnosis:   ICD-10-CM   1. 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 Attention deficit hyperactivity disorder (ADHD), combined type  F90.2   3. 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 Excessive caffeine intake  Z78.9    Plan:  . Today, see through a priority task . Prioritize disposing of patient records -- either give to the relevant person or dispose according to standards . Practice setting single, short-term priorities and seeing through without sidetracking . Other recommendations/advice as may be noted above . Continue to utilize previously learned skills ad lib . Maintain medication as prescribed and work faithfully with relevant prescriber(s) if any changes are desired or seem indicated . Call the clinic on-call service, present to ER, or call 911 if any life-threatening psychiatric crisis Return for time as available, will call. . Already scheduled visit in this office 12/15/2020.  Robley Fries, PhD Marliss Czar, PhD LP Clinical Psychologist, [facility] [address], [address] [place], [place] [address] [phone]

## 2020-12-15 ENCOUNTER — Encounter: Payer: Self-pay | Admitting: Psychiatry

## 2020-12-15 ENCOUNTER — Telehealth (INDEPENDENT_AMBULATORY_CARE_PROVIDER_SITE_OTHER): Payer: Medicare HMO | Admitting: Psychiatry

## 2020-12-15 DIAGNOSIS — Z789 Other specified health status: Secondary | ICD-10-CM | POA: Diagnosis not present

## 2020-12-15 DIAGNOSIS — F423 Hoarding disorder: Secondary | ICD-10-CM | POA: Diagnosis not present

## 2020-12-15 DIAGNOSIS — F902 Attention-deficit hyperactivity disorder, combined type: Secondary | ICD-10-CM

## 2020-12-15 DIAGNOSIS — F411 Generalized anxiety disorder: Secondary | ICD-10-CM | POA: Diagnosis not present

## 2020-12-15 DIAGNOSIS — F330 Major depressive disorder, recurrent, mild: Secondary | ICD-10-CM | POA: Diagnosis not present

## 2020-12-15 DIAGNOSIS — R69 Illness, unspecified: Secondary | ICD-10-CM | POA: Diagnosis not present

## 2020-12-15 MED ORDER — AMPHETAMINE-DEXTROAMPHETAMINE 30 MG PO TABS: 30.0000 mg | ORAL_TABLET | Freq: Three times a day (TID) | ORAL | 0 refills | Status: DC

## 2020-12-15 MED ORDER — AMPHETAMINE-DEXTROAMPHETAMINE 30 MG PO TABS
30.0000 mg | ORAL_TABLET | Freq: Three times a day (TID) | ORAL | 0 refills | Status: DC
Start: 2021-01-12 — End: 2021-01-30

## 2020-12-15 MED ORDER — LAMOTRIGINE 100 MG PO TABS: 100.0000 mg | ORAL_TABLET | Freq: Every day | ORAL | 1 refills | Status: DC

## 2020-12-15 MED ORDER — AMPHETAMINE-DEXTROAMPHETAMINE 30 MG PO TABS
30.0000 mg | ORAL_TABLET | Freq: Three times a day (TID) | ORAL | 0 refills | Status: DC
Start: 2020-12-15 — End: 2021-01-30

## 2020-12-15 NOTE — Progress Notes (Signed)
Justin Marshall [id number] Aug 27, 1951 70 y.o.  Virtual Visit via Telephone Note  I connected with pt by telephone and verified that I am speaking with the correct person using two identifiers.   I discussed the limitations, risks, security and privacy concerns of performing an evaluation and management service by telephone and the availability of in person appointments. I also discussed with the patient that there may be a patient responsible charge related to this service. The patient expressed understanding and agreed to proceed.  I discussed the assessment and treatment plan with the patient. The patient was provided an opportunity to ask questions and all were answered. The patient agreed with the plan and demonstrated an understanding of the instructions.   The patient was advised to call back or seek an in-person evaluation if the symptoms worsen or if the condition fails to improve as anticipated.  I provided 30 minutes of non-face-to-face time during this encounter. The call started at 2:00 and ended at 230. The patient was located at home and the provider was located office.   Subjective:   Patient ID:  Justin Marshall is a 70 y.o. (DOB 1951-08-13) male.  Chief Complaint:  Chief Complaint  Patient presents with  . Follow-up  . Mild recurrent major depression (HCC)  . ADHD    HPI Justin Marshall presents for follow-up of their ADD, history of anxiety and history of depression.  At visit Jan 10, 2019.  We initiated a return to Lexapro 20 mg for some depressive and obsessive symptoms.  He was alternating between Vyvanse and Adderall because of cost.  12/12/19 appt with following noted: Pretty good overall.  Good sleep.  L-methylfolate too expensive.  Iron level low.  Taking vitamin D usually. D got on his nerves decluttering him and throwing things away.     Gets distracted cleaning and straightening up.   Semi retired working 3 days a week. Feels better with less depression  and less negative and less obsessive negative thinking.  His clinic closed bc of Covid affecting the clinic.  Still struggles getting things done bc overwhelmed with demands.  May be a little anemic and that causes some tiredness.  B12 is normal.   Riding bike.  Tend to be depressed more than he wants to be.  Can be depressed and some obsessiveness and can't let go of things.   Plan: For cost reasons he has stopped Vyvanse and switched to Adderall 30 mg 3 times daily.   Continue Lexapro 10 and lithium 300 mg daily and lamotrigine 150. No med changes today.  06/30/2020 appointment with the following noted: No covid.  Vaccinated. Overall maintaining mood.  Lonely.  Youngest D PHD psychologist and just got married.  3rd D is still in school in PHD and causes some stress.  2nd D in [place] and has GD.  Oldest D NP with [facility].   Disc withdrawal with Adderall.  Still takes longer than he wants to do things.   Plan no med changes  12/15/2020 appointment with the following noted: Occ feels winded but cardiologist says he's OK.  Energy is variable from good to not so good.  Sleep seems pretty good overall.  Admits he still has too much caffeine and often after 6 PM.  Would like to meet a woman. Asks about meds and tiredness. Overall mood and anxiety are OK.  Drags some in the morning wondering what to do with the day.  Good interest and enjoyment.  No unusual mood swings.  Stays busy.  Patient reports stable mood and denies irritable moods.  No manic symptoms.  Patient denies any recent difficulty with anxiety.  Patient denies difficulty with sleep initiation or maintenance. 6-8 hours sleep.   Denies appetite disturbance.  Patient reports that  motivation have been good.  Patient still has problems with concentration.  Patient denies any suicidal ideation.  Past Psychiatric Medication Trials: Modafinil, Strattera tremor, Wellbutrin, Vyvanse, Ritalin, Aricept off label without benefit,  Adzenys, Adderall 30 TID, lamotrigine,  Depakote tremor,  Lexapro 10  Review of Systems:  Review of Systems  Constitutional: Positive for fatigue.  Cardiovascular: Negative for chest pain and palpitations.  Neurological: Negative for weakness.  Psychiatric/Behavioral: Positive for decreased concentration.    Medications: I have reviewed the patient's current medications.  Current Outpatient Medications  Medication Sig Dispense Refill  . [START ON 01/12/2021] amphetamine-dextroamphetamine (ADDERALL) 30 MG tablet Take 1 tablet by mouth 3 (three) times daily. 90 tablet 0  . [START ON 02/09/2021] amphetamine-dextroamphetamine (ADDERALL) 30 MG tablet Take 1 tablet by mouth 3 (three) times daily. 90 tablet 0  . atorvastatin (LIPITOR) 10 MG tablet TAKE 1 TABLET (10 MG TOTAL) BY MOUTH DAILY. PLEASE CALL OFFICE FOR FURTHER REFILLS 90 tablet 0  . Cholecalciferol (VITAMIN D3) 1.25 MG (50000 UT) CAPS Take 50,000 Units by mouth once a week.    . escitalopram (LEXAPRO) 10 MG tablet Take 1 tablet (10 mg total) by mouth daily. 180 tablet 1  . lithium carbonate 300 MG capsule Take 1 capsule (300 mg total) by mouth daily. 90 capsule 3  . Multiple Vitamin (MULTIVITAMIN) capsule Take 1 capsule by mouth daily.    . naproxen sodium (ALEVE) 220 MG tablet Take 220 mg by mouth. Prn wrist pain    . amphetamine-dextroamphetamine (ADDERALL) 30 MG tablet Take 1 tablet by mouth 3 (three) times daily. 90 tablet 0  . aspirin EC 81 MG tablet Take 81 mg by mouth. occas (Patient not taking: Reported on 12/15/2020)    . lamoTRIgine (LAMICTAL) 100 MG tablet Take 1 tablet (100 mg total) by mouth daily. 90 tablet 1  . Omega 3 1200 MG CAPS Take by mouth. 1-2 caps daily (Patient not taking: Reported on 12/15/2020)     No current facility-administered medications for this visit.    Medication Side Effects: None  Allergies: No Known Allergies  Past Medical History:  Diagnosis Date  . ADHD   . Anxiety and depression   .  Vitamin D insufficiency     Family History  Problem Relation Age of Onset  . Heart failure Father   . Diabetes Father   . Diabetes Sister   . Bipolar disorder Sister   . Heart disease Brother   . Hepatitis C Brother   . Depression Brother     Social History   Socioeconomic History  . Marital status: Divorced    Spouse name: Not on file  . Number of children: 4  . Years of education: Not on file  . Highest education level: Not on file  Occupational History  . Not on file  Tobacco Use  . Smoking status: Never Smoker  . Smokeless tobacco: Never Used  Vaping Use  . Vaping Use: Never used  Substance and Sexual Activity  . Alcohol use: Yes    Alcohol/week: 7.0 standard drinks    Types: 7 Shots of liquor per week    Comment: occas  . Drug use: Never  . Sexual activity: Not on  file  Other Topics Concern  . Not on file  Social History Narrative  . Not on file   Social Determinants of Health   Financial Resource Strain: Not on file  Food Insecurity: Not on file  Transportation Needs: Not on file  Physical Activity: Not on file  Stress: Not on file  Social Connections: Not on file  Intimate Partner Violence: Not on file    Past Medical History, Surgical history, Social history, and Family history were reviewed and updated as appropriate.   Please see review of systems for further details on the patient's review from today.   Objective:   Physical Exam:  There were no vitals taken for this visit.  Physical Exam Neurological:     Mental Status: He is alert and oriented to person, place, and time.     Cranial Nerves: No dysarthria.  Psychiatric:        Attention and Perception: Attention normal.        Mood and Affect: Mood is not anxious or depressed.        Speech: Speech normal.        Behavior: Behavior is cooperative.        Thought Content: Thought content normal. Thought content is not paranoid or delusional. Thought content does not include homicidal or  suicidal ideation. Thought content does not include homicidal or suicidal plan.        Cognition and Memory: Cognition and memory normal.        Judgment: Judgment normal.     Comments: Insight good.       Lab Review:     Component Value Date/Time   NA 139 11/15/2018 1235   K 4.2 11/15/2018 1235   CL 104 11/15/2018 1235   CO2 26 11/15/2018 1235   GLUCOSE 117 (H) 11/15/2018 1235   BUN 22 11/15/2018 1235   CREATININE 1.10 11/15/2018 1235   CALCIUM 9.4 11/15/2018 1235   GFRNONAA 69 11/15/2018 1235   GFRAA 80 11/15/2018 1235       Component Value Date/Time   WBC 7.4 11/15/2018 1235   RBC 4.21 11/15/2018 1235   HGB 13.0 (L) 11/15/2018 1235   HCT 39.4 11/15/2018 1235   PLT 287 11/15/2018 1235   MCV 93.6 11/15/2018 1235   MCH 30.9 11/15/2018 1235   MCHC 33.0 11/15/2018 1235   RDW 13.2 11/15/2018 1235   LYMPHSABS 1,029 11/15/2018 1235   EOSABS 104 11/15/2018 1235   BASOSABS 30 11/15/2018 1235    No results found for: POCLITH, LITHIUM   No results found for: PHENYTOIN, PHENOBARB, VALPROATE, CBMZ   .res Assessment: Plan:    Rooney was seen today for follow-up, mild recurrent major depression (hcc) and adhd.  Diagnoses and all orders for this visit:  Mild recurrent major depression (HCC) -     lamoTRIgine (LAMICTAL) 100 MG tablet; Take 1 tablet (100 mg total) by mouth daily.  Attention deficit hyperactivity disorder (ADHD), combined type -     amphetamine-dextroamphetamine (ADDERALL) 30 MG tablet; Take 1 tablet by mouth 3 (three) times daily. -     amphetamine-dextroamphetamine (ADDERALL) 30 MG tablet; Take 1 tablet by mouth 3 (three) times daily. -     amphetamine-dextroamphetamine (ADDERALL) 30 MG tablet; Take 1 tablet by mouth 3 (three) times daily.  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  He has taken this medication in the past and has noticed benefit again this time..  As noted before we have examined him for bipolar symptoms repeatedly and not seen evidence for that.  However he is aware that if he has latent bipolar tendencies this could trigger them and if he has any unusual manic or hypomanic symptoms he will let us know.  Discussed side effects of SSRIs.  Disc normal dosage range. Disc low dose Lexapro SSRI is not that great for obsessiveness.  So will not increase. He says threats from kids to throw things away is the best motivator. Cont to work on getting rid of clutter.   For cost reasons he has stopped Vyvanse and switched to Adderall 30 mg 3 times daily.  He is aware this is a high dose but feels it is medically necessary.  His ADD has been severe and chronic and it is appropriate.  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    He is going to follow-up with his cardiologist as a matter of routine discussed the risk of between dosage sedation if he is not careful to space them out.  He is aware.  Reduce caffeine and none after 3 PM.   Asks about meds and sleepiness.    Discussed potential benefits, risks, and side effects of stimulants with patient to include increased heart rate, palpitations, insomnia, increased anxiety, increased irritability, or decreased appetite.  Instructed patient to contact office if experiencing any significant tolerability issues.  L-methylfolate was too expensive.  He could consider it again using good Rx but that is not convenient to him.  Continue lithium 300 mg daily.  No med changes today except trial reduction in lamotrigine to 100 mg daily to see if energy is better.  He plans to continue vitamin D.  He is continuing counseling with Dr. Laurell Roof. We discussed ADHD strategies.  Follow-up 6 months  Iona Hansen, MD,  DFAPA   Please see After Visit Summary for patient specific instructions.  No future appointments.  No orders of the defined types were placed in this encounter.     -------------------------------

## 2020-12-23 ENCOUNTER — Other Ambulatory Visit: Payer: Self-pay | Admitting: Cardiology

## 2020-12-23 DIAGNOSIS — I700 Atherosclerosis of aorta: Secondary | ICD-10-CM

## 2021-01-21 DIAGNOSIS — Z1389 Encounter for screening for other disorder: Secondary | ICD-10-CM | POA: Diagnosis not present

## 2021-01-21 DIAGNOSIS — Z0000 Encounter for general adult medical examination without abnormal findings: Secondary | ICD-10-CM | POA: Diagnosis not present

## 2021-01-22 DIAGNOSIS — Z0000 Encounter for general adult medical examination without abnormal findings: Secondary | ICD-10-CM | POA: Diagnosis not present

## 2021-01-22 DIAGNOSIS — N400 Enlarged prostate without lower urinary tract symptoms: Secondary | ICD-10-CM | POA: Diagnosis not present

## 2021-01-22 DIAGNOSIS — M25531 Pain in right wrist: Secondary | ICD-10-CM | POA: Diagnosis not present

## 2021-01-30 ENCOUNTER — Telehealth: Payer: Self-pay | Admitting: Psychiatry

## 2021-01-30 ENCOUNTER — Other Ambulatory Visit: Payer: Self-pay | Admitting: Psychiatry

## 2021-01-30 DIAGNOSIS — F902 Attention-deficit hyperactivity disorder, combined type: Secondary | ICD-10-CM

## 2021-01-30 MED ORDER — AMPHETAMINE-DEXTROAMPHETAMINE 30 MG PO TABS: 30.0000 mg | ORAL_TABLET | Freq: Three times a day (TID) | ORAL | 0 refills | Status: DC

## 2021-01-30 NOTE — Telephone Encounter (Signed)
Pt called reporting he got last Adderall Rx filled 5/2 and pharmacy stated note for refill on Rx is 6/13. [facility] [place]. Contact # [phone]

## 2021-01-30 NOTE — Telephone Encounter (Signed)
Pt informed

## 2021-01-30 NOTE — Telephone Encounter (Signed)
I sent 3 updated prescriptions for Adderall 1 which could be filled today

## 2021-01-30 NOTE — Telephone Encounter (Signed)
Pmp confirms it was filled 5/2 and 3 Rx were sent on 4/18.The one for june does say 6/13.What can we do about this?

## 2021-03-19 ENCOUNTER — Ambulatory Visit: Payer: Medicare HMO | Admitting: Psychiatry

## 2021-03-26 ENCOUNTER — Other Ambulatory Visit: Payer: Self-pay | Admitting: Cardiology

## 2021-03-26 DIAGNOSIS — I700 Atherosclerosis of aorta: Secondary | ICD-10-CM

## 2021-04-16 ENCOUNTER — Other Ambulatory Visit: Payer: Self-pay

## 2021-04-16 ENCOUNTER — Ambulatory Visit (INDEPENDENT_AMBULATORY_CARE_PROVIDER_SITE_OTHER): Payer: Medicare HMO | Admitting: Psychiatry

## 2021-04-16 DIAGNOSIS — F423 Hoarding disorder: Secondary | ICD-10-CM

## 2021-04-16 DIAGNOSIS — F902 Attention-deficit hyperactivity disorder, combined type: Secondary | ICD-10-CM | POA: Diagnosis not present

## 2021-04-16 DIAGNOSIS — F330 Major depressive disorder, recurrent, mild: Secondary | ICD-10-CM | POA: Diagnosis not present

## 2021-04-16 DIAGNOSIS — Z636 Dependent relative needing care at home: Secondary | ICD-10-CM | POA: Diagnosis not present

## 2021-04-16 DIAGNOSIS — R69 Illness, unspecified: Secondary | ICD-10-CM | POA: Diagnosis not present

## 2021-04-16 NOTE — Progress Notes (Signed)
Psychotherapy Progress Note [facility] Marliss Czar, PhD LP  Patient ID: Justin Marshall     MRN: [id number] Therapy format: Individual psychotherapy Date: 04/16/2021      Start: 3:11p     Stop: 4:00p     Time Spent: 49 min Location: In-person   Session narrative (presenting needs, interim history, self-report of stressors and symptoms, applications of prior therapy, status changes, and interventions made in session) First session in 4 months.  Realizes he's been doing the same things expecting different results.  71th birthday coming, daunting.  A phlebotomist flirted with him and he wonders why he didn't take her up on it.  Offered that he might have a well-earned sense of caution about being used or set up for disappointment, based on marriage, the nurse who forged prescriptions, the unscrupulous partner, and even a friend of daughter's he hired once to clean who stole from his house.  Has kept an online dating account but goes back and forth about whether to cancel it.  Encouraged that he can cancel it and restart it later, and that, like many things, he could choose one option for a while then reverse himself without harm.  Justin Marshall remains in a group home in [place].  Had possible plan to go to [place], but passport is outdated, and several options fell through for traveling companions.  Another project hanging with land in [place] that needs to be sold.  Various machines at his house in states of disrepair or near-terminal, patched-together condition.  Still tends to try to tinker and fix things himself, but warming up to hiring out more for cleaning and yard work.  Addressed self-downing thoughts about getting things done, hobby/addiction to squeezing a little more life out of machines, and ways to coach himself through difficulties.  Therapeutic modalities: Cognitive Behavioral Therapy and Solution-Oriented/Positive Psychology  Mental  Status/Observations:  Appearance:   Casual     Behavior:  Appropriate  Motor:  Normal  Speech/Language:   Clear and Coherent  Affect:  Appropriate  Mood:  dysthymic  Thought process:  normal  Thought content:    WNL  Sensory/Perceptual disturbances:    WNL  Orientation:  Fully oriented  Attention:  Good    Concentration:  Good  Memory:  WNL  Insight:    Good  Judgment:   Good  Impulse Control:  Fair   Risk Assessment: Danger to Self: No Self-injurious Behavior: No Danger to Others: No Physical Aggression / Violence: No Duty to Warn: No Access to Firearms a concern: No  Assessment of progress:  stabilized  Diagnosis:   ICD-10-CM   1. Mild recurrent major depression (HCC)  F33.0     2. 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 Attention deficit hyperactivity disorder (ADHD), combined type  F90.2     4. Caregiver stress  Z63.6      Plan:  Self-affirm he may have good reasons, at least historically, not to get involved quickly with someone -- it's not "chicken" Anti-procrastination -- look for opportunities to recognize two good choices what to do and just activate one Decluttering -- self-affirm anything he handles sooner is something his daughters won't have to later, try to do a little something, more often, instead of "plan" or "decide" Unblocking -- look for opportunities to go head and choose one option, knowing he can reverse himself later Other recommendations/advice as may be noted above Continue to  utilize previously learned skills ad lib Maintain medication as prescribed and work faithfully with relevant prescriber(s) if any changes are desired or seem indicated Call the clinic on-call service, present to ER, or call 911 if any life-threatening psychiatric crisis Return for time as available. Already scheduled visit in this office Visit date not found.  Robley Fries, PhD Marliss Czar, PhD LP Clinical  Psychologist, [facility] [address] [place], [place] [address] [phone]

## 2021-04-29 ENCOUNTER — Telehealth: Payer: Self-pay | Admitting: Psychiatry

## 2021-04-29 ENCOUNTER — Other Ambulatory Visit: Payer: Self-pay

## 2021-04-29 DIAGNOSIS — F902 Attention-deficit hyperactivity disorder, combined type: Secondary | ICD-10-CM

## 2021-04-29 MED ORDER — AMPHETAMINE-DEXTROAMPHETAMINE 30 MG PO TABS: 30.0000 mg | ORAL_TABLET | Freq: Three times a day (TID) | ORAL | 0 refills | Status: DC

## 2021-04-29 NOTE — Telephone Encounter (Signed)
Pended.

## 2021-04-29 NOTE — Telephone Encounter (Signed)
Pt called and made an appt for 11/3. He needs a refill on his adderall 30 mg [facility] [address] [place]

## 2021-06-18 ENCOUNTER — Other Ambulatory Visit: Payer: Self-pay | Admitting: Cardiology

## 2021-06-18 DIAGNOSIS — I700 Atherosclerosis of aorta: Secondary | ICD-10-CM

## 2021-06-22 ENCOUNTER — Other Ambulatory Visit: Payer: Self-pay | Admitting: Psychiatry

## 2021-06-22 DIAGNOSIS — F330 Major depressive disorder, recurrent, mild: Secondary | ICD-10-CM

## 2021-06-30 ENCOUNTER — Other Ambulatory Visit: Payer: Self-pay

## 2021-06-30 ENCOUNTER — Telehealth: Payer: Self-pay | Admitting: Psychiatry

## 2021-06-30 DIAGNOSIS — F902 Attention-deficit hyperactivity disorder, combined type: Secondary | ICD-10-CM

## 2021-06-30 MED ORDER — AMPHETAMINE-DEXTROAMPHETAMINE 30 MG PO TABS: 30.0000 mg | ORAL_TABLET | Freq: Three times a day (TID) | ORAL | 0 refills | Status: DC

## 2021-06-30 NOTE — Telephone Encounter (Signed)
Patient rtc stating that he was to to call back and let [facility] know if they have any Adderall 30mg  in stock. States that they do and would like prescription filled. Ph: [phone] Appt 11/3. [facility] [address] [place]

## 2021-06-30 NOTE — Telephone Encounter (Signed)
Pended.

## 2021-07-02 ENCOUNTER — Ambulatory Visit (INDEPENDENT_AMBULATORY_CARE_PROVIDER_SITE_OTHER): Payer: Medicare HMO | Admitting: Psychiatry

## 2021-07-02 ENCOUNTER — Encounter: Payer: Self-pay | Admitting: Psychiatry

## 2021-07-02 ENCOUNTER — Other Ambulatory Visit: Payer: Self-pay

## 2021-07-02 DIAGNOSIS — F423 Hoarding disorder: Secondary | ICD-10-CM | POA: Diagnosis not present

## 2021-07-02 DIAGNOSIS — R69 Illness, unspecified: Secondary | ICD-10-CM | POA: Diagnosis not present

## 2021-07-02 DIAGNOSIS — F411 Generalized anxiety disorder: Secondary | ICD-10-CM

## 2021-07-02 DIAGNOSIS — F330 Major depressive disorder, recurrent, mild: Secondary | ICD-10-CM | POA: Diagnosis not present

## 2021-07-02 DIAGNOSIS — F422 Mixed obsessional thoughts and acts: Secondary | ICD-10-CM | POA: Diagnosis not present

## 2021-07-02 DIAGNOSIS — F902 Attention-deficit hyperactivity disorder, combined type: Secondary | ICD-10-CM | POA: Diagnosis not present

## 2021-07-02 DIAGNOSIS — Z789 Other specified health status: Secondary | ICD-10-CM

## 2021-07-02 MED ORDER — FLUOXETINE HCL 20 MG PO CAPS: 20.0000 mg | ORAL_CAPSULE | Freq: Every day | ORAL | 0 refills | Status: DC

## 2021-07-02 MED ORDER — AMPHETAMINE-DEXTROAMPHETAMINE 30 MG PO TABS: 30.0000 mg | ORAL_TABLET | Freq: Three times a day (TID) | ORAL | 0 refills | Status: DC

## 2021-07-02 NOTE — Progress Notes (Signed)
Justin Marshall [id number] February 24, 1951 70 y.o.   Subjective:   Patient ID:  Justin Marshall is a 70 y.o. (DOB 24-Jul-1951) male.  Chief Complaint:  Chief Complaint  Patient presents with  . Follow-up    Mood and anxiety  . ADD    HPI TERRALL BLEY presents for follow-up of their ADD, history of anxiety and history of depression.  At visit Jan 10, 2019.  We initiated a return to Lexapro 20 mg for some depressive and obsessive symptoms.  He was alternating between Vyvanse and Adderall because of cost.  12/12/19 appt with following noted: Pretty good overall.  Good sleep.  L-methylfolate too expensive.  Iron level low.  Taking vitamin D usually. D got on his nerves decluttering him and throwing things away.     Gets distracted cleaning and straightening up.   Semi retired working 3 days a week. Feels better with less depression and less negative and less obsessive negative thinking.  His clinic closed bc of Covid affecting the clinic.  Still struggles getting things done bc overwhelmed with demands.  May be a little anemic and that causes some tiredness.  B12 is normal.   Riding bike.  Tend to be depressed more than he wants to be.  Can be depressed and some obsessiveness and can't let go of things.   Plan: For cost reasons he has stopped Vyvanse and switched to Adderall 30 mg 3 times daily.   Continue Lexapro 10 and lithium 300 mg daily and lamotrigine 150. No med changes today.  06/30/2020 appointment with the following noted: No covid.  Vaccinated. Overall maintaining mood.  Lonely.  Youngest D PHD psychologist and just got married.  3rd D is still in school in PHD and causes some stress.  2nd D in [place] and has GD.  Oldest D NP with [facility].   Disc withdrawal with Adderall.  Still takes longer than he wants to do things.   Plan no med changes  12/15/2020 appointment with the following noted: Occ feels winded but cardiologist says he's OK.  Energy is variable from good to  not so good.  Sleep seems pretty good overall.  Admits he still has too much caffeine and often after 6 PM.  Would like to meet a woman. Asks about meds and tiredness. Overall mood and anxiety are OK.  Drags some in the morning wondering what to do with the day.  Good interest and enjoyment.   Plan: Continue lithium 300 mg daily.  Lexapro 10 mg daily and lamotrigine 100 mg daily. And Adderall 30 mg TID No med changes today except trial reduction in lamotrigine to 100 mg daily to see if energy is better.  07/02/2021 appointment with the following noted: Mood has been OK.  Mostly same old issues.  Need to work on negative thought process.  Still looking for savior girl friend to rescue me but realizes it's not the right way to think. Hard to keep the house clean.  95 yo grand daughter said his kitchen was a mess.   Still chronically sleepy and tired. Usually sleep average 7 hours.  No unusual mood swings.  Stays busy.  Patient reports stable mood and denies irritable moods.  No manic symptoms.  Patient denies any recent difficulty with anxiety.  Patient denies difficulty with sleep initiation or maintenance. 6-8 hours sleep.   Denies appetite disturbance.  Patient reports that  motivation have been good.  Patient still has problems with concentration.  Patient denies any suicidal ideation.  Past Psychiatric Medication Trials: Modafinil, Strattera tremor, Wellbutrin, Vyvanse, Ritalin, Aricept off label without benefit, Adzenys, Adderall 30 TID, lamotrigine,  Depakote tremor,   Lexapro 10  Review of Systems:  Review of Systems  Constitutional:  Positive for fatigue.  Cardiovascular:  Negative for chest pain and palpitations.  Neurological:  Negative for tremors and weakness.  Psychiatric/Behavioral:  Positive for decreased concentration.    Medications: I have reviewed the patient's current medications.  Current Outpatient Medications  Medication Sig Dispense Refill  .  amphetamine-dextroamphetamine (ADDERALL) 30 MG tablet Take 1 tablet by mouth 3 (three) times daily. 90 tablet 0  . amphetamine-dextroamphetamine (ADDERALL) 30 MG tablet Take 1 tablet by mouth 3 (three) times daily. 90 tablet 0  . amphetamine-dextroamphetamine (ADDERALL) 30 MG tablet Take 1 tablet by mouth 3 (three) times daily. 90 tablet 0  . aspirin EC 81 MG tablet Take 81 mg by mouth. occas    . atorvastatin (LIPITOR) 10 MG tablet TAKE 1 TABLET (10 MG TOTAL) BY MOUTH DAILY. PLEASE CALL OFFICE FOR FURTHER REFILLS 90 tablet 0  . Cholecalciferol (VITAMIN D3) 1.25 MG (50000 UT) CAPS Take 50,000 Units by mouth once a week.    . escitalopram (LEXAPRO) 10 MG tablet Take 1 tablet (10 mg total) by mouth daily. 180 tablet 1  . lamoTRIgine (LAMICTAL) 100 MG tablet TAKE 1 TABLET BY MOUTH EVERY DAY 30 tablet 0  . Multiple Vitamin (MULTIVITAMIN) capsule Take 1 capsule by mouth daily.    . naproxen sodium (ALEVE) 220 MG tablet Take 220 mg by mouth. Prn wrist pain    . Omega 3 1200 MG CAPS Take by mouth. 1-2 caps daily    . lithium carbonate 300 MG capsule Take 1 capsule (300 mg total) by mouth daily. (Patient not taking: Reported on 07/02/2021) 90 capsule 3   No current facility-administered medications for this visit.    Medication Side Effects: None  Allergies: No Known Allergies  Past Medical History:  Diagnosis Date  . ADHD   . Anxiety and depression   . Vitamin D insufficiency     Family History  Problem Relation Age of Onset  . Heart failure Father   . Diabetes Father   . Diabetes Sister   . Bipolar disorder Sister   . Heart disease Brother   . Hepatitis C Brother   . Depression Brother     Social History   Socioeconomic History  . Marital status: Divorced    Spouse name: Not on file  . Number of children: 4  . Years of education: Not on file  . Highest education level: Not on file  Occupational History  . Not on file  Tobacco Use  . Smoking status: Never  . Smokeless  tobacco: Never  Vaping Use  . Vaping Use: Never used  Substance and Sexual Activity  . Alcohol use: Yes    Alcohol/week: 7.0 standard drinks    Types: 7 Shots of liquor per week    Comment: occas  . Drug use: Never  . Sexual activity: Not on file  Other Topics Concern  . Not on file  Social History Narrative  . Not on file   Social Determinants of Health   Financial Resource Strain: Not on file  Food Insecurity: Not on file  Transportation Needs: Not on file  Physical Activity: Not on file  Stress: Not on file  Social Connections: Not on file  Intimate Partner Violence: Not on file  Past Medical History, Surgical history, Social history, and Family history were reviewed and updated as appropriate.   Please see review of systems for further details on the patient's review from today.   Objective:   Physical Exam:  There were no vitals taken for this visit.  Physical Exam Constitutional:      General: He is not in acute distress. Musculoskeletal:        General: No deformity.  Neurological:     Mental Status: He is alert and oriented to person, place, and time.     Cranial Nerves: No dysarthria.     Coordination: Coordination normal.  Psychiatric:        Attention and Perception: Attention and perception normal. He does not perceive auditory or visual hallucinations.        Mood and Affect: Mood is anxious. Mood is not depressed. Affect is not labile, angry or inappropriate.        Speech: Speech normal.        Behavior: Behavior normal. Behavior is cooperative.        Thought Content: Thought content normal. Thought content is not paranoid or delusional. Thought content does not include homicidal or suicidal ideation.        Cognition and Memory: Cognition and memory normal.        Judgment: Judgment normal.     Comments: Insight good.   Chronic OCD sx incl cleaning but dreads it and procrastinates    Lab Review:     Component Value Date/Time   NA 139  11/15/2018 1235   K 4.2 11/15/2018 1235   CL 104 11/15/2018 1235   CO2 26 11/15/2018 1235   GLUCOSE 117 (H) 11/15/2018 1235   BUN 22 11/15/2018 1235   CREATININE 1.10 11/15/2018 1235   CALCIUM 9.4 11/15/2018 1235   GFRNONAA 69 11/15/2018 1235   GFRAA 80 11/15/2018 1235       Component Value Date/Time   WBC 7.4 11/15/2018 1235   RBC 4.21 11/15/2018 1235   HGB 13.0 (L) 11/15/2018 1235   HCT 39.4 11/15/2018 1235   PLT 287 11/15/2018 1235   MCV 93.6 11/15/2018 1235   MCH 30.9 11/15/2018 1235   MCHC 33.0 11/15/2018 1235   RDW 13.2 11/15/2018 1235   LYMPHSABS 1,029 11/15/2018 1235   EOSABS 104 11/15/2018 1235   BASOSABS 30 11/15/2018 1235    No results found for: POCLITH, LITHIUM   No results found for: PHENYTOIN, PHENOBARB, VALPROATE, CBMZ   .res Assessment: Plan:    Voyle was seen today for follow-up and add.  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  He has taken this medication in the past and has noticed benefit again this time..  As noted before we have examined him for bipolar symptoms repeatedly and not seen evidence for that.  However he is aware that if he has latent bipolar tendencies this could trigger them and if he has any unusual manic or hypomanic symptoms he will let us know.  Discussed side effects of SSRIs.  Disc normal dosage range.  Cont to work on getting rid of clutter. Option switch to fluoxetine bc less likely to  cause fatigue.  Does have some chronic OCD tendencies.   For cost reasons he has stopped Vyvanse and switched to Adderall 30 mg 3 times daily.   He is aware this is a high dose but feels it is medically necessary.  His ADD has been severe and chronic and it is appropriate.  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    He is going to follow-up with his cardiologist as a matter of routine discussed the risk of between dosage sedation if he is not careful to space them out.  He is aware.  Reduce caffeine and none after 3 PM.   Asks about meds and sleepiness.    Discussed potential benefits, risks, and side effects of stimulants with patient to include increased heart rate, palpitations, insomnia, increased anxiety, increased irritability, or decreased appetite.  Instructed patient to contact office if experiencing any significant tolerability issues.  L-methylfolate was too expensive.  He could consider it again using good Rx but that is not convenient to him.  Continue lithium 300 mg daily.  continue lamotrigine to 100 mg daily . Also trial CO Q 10 for energy and if not helpful can stop it.  He plans to continue vitamin D.  He is continuing counseling with Dr. Laurell Roof. We discussed ADHD strategies.  Follow-up 3 months  Iona Hansen, MD, DFAPA   Please see After Visit Summary for patient specific instructions.  No future appointments.  No orders of the defined types were placed in this encounter.     -------------------------------

## 2021-07-14 ENCOUNTER — Other Ambulatory Visit: Payer: Self-pay | Admitting: Psychiatry

## 2021-07-14 DIAGNOSIS — F330 Major depressive disorder, recurrent, mild: Secondary | ICD-10-CM

## 2021-09-17 ENCOUNTER — Other Ambulatory Visit: Payer: Self-pay | Admitting: Cardiology

## 2021-09-17 DIAGNOSIS — I700 Atherosclerosis of aorta: Secondary | ICD-10-CM

## 2021-10-02 ENCOUNTER — Other Ambulatory Visit: Payer: Self-pay | Admitting: Psychiatry

## 2021-10-02 DIAGNOSIS — F330 Major depressive disorder, recurrent, mild: Secondary | ICD-10-CM

## 2021-10-02 DIAGNOSIS — F422 Mixed obsessional thoughts and acts: Secondary | ICD-10-CM

## 2021-10-02 DIAGNOSIS — F423 Hoarding disorder: Secondary | ICD-10-CM

## 2021-10-03 ENCOUNTER — Other Ambulatory Visit: Payer: Self-pay | Admitting: Cardiology

## 2021-10-03 DIAGNOSIS — I700 Atherosclerosis of aorta: Secondary | ICD-10-CM

## 2021-10-05 ENCOUNTER — Ambulatory Visit (INDEPENDENT_AMBULATORY_CARE_PROVIDER_SITE_OTHER): Payer: Medicare HMO | Admitting: Psychiatry

## 2021-10-05 ENCOUNTER — Other Ambulatory Visit: Payer: Self-pay

## 2021-10-05 ENCOUNTER — Encounter: Payer: Self-pay | Admitting: Psychiatry

## 2021-10-05 DIAGNOSIS — F411 Generalized anxiety disorder: Secondary | ICD-10-CM

## 2021-10-05 DIAGNOSIS — F902 Attention-deficit hyperactivity disorder, combined type: Secondary | ICD-10-CM

## 2021-10-05 DIAGNOSIS — F423 Hoarding disorder: Secondary | ICD-10-CM

## 2021-10-05 DIAGNOSIS — R69 Illness, unspecified: Secondary | ICD-10-CM | POA: Diagnosis not present

## 2021-10-05 DIAGNOSIS — Z789 Other specified health status: Secondary | ICD-10-CM

## 2021-10-05 DIAGNOSIS — F422 Mixed obsessional thoughts and acts: Secondary | ICD-10-CM

## 2021-10-05 DIAGNOSIS — F330 Major depressive disorder, recurrent, mild: Secondary | ICD-10-CM | POA: Diagnosis not present

## 2021-10-05 MED ORDER — AMPHETAMINE-DEXTROAMPHETAMINE 30 MG PO TABS: 30.0000 mg | ORAL_TABLET | Freq: Three times a day (TID) | ORAL | 0 refills | Status: DC

## 2021-10-05 MED ORDER — FLUOXETINE HCL 20 MG PO CAPS: 20.0000 mg | ORAL_CAPSULE | Freq: Every day | ORAL | 1 refills | Status: DC

## 2021-10-05 MED ORDER — LAMOTRIGINE 100 MG PO TABS: 100.0000 mg | ORAL_TABLET | Freq: Every day | ORAL | 3 refills | Status: DC

## 2021-10-05 NOTE — Progress Notes (Signed)
[name] [id number] [birth year] 71 y.o.   Subjective:   Patient ID:  Justin Marshall is a 71 y.o. (DOB 1951/04/24) male.  Chief Complaint:  Chief Complaint  Patient presents with   Follow-up   ADHD    HPI Justin Marshall presents for follow-up of their ADD, history of anxiety and history of depression.  At visit Jan 10, 2019.  We initiated a return to Lexapro 20 mg for some depressive and obsessive symptoms.  He was alternating between Vyvanse and Adderall because of cost.  12/12/19 appt with following noted: Pretty good overall.  Good sleep.  L-methylfolate too expensive.  Iron level low.  Taking vitamin D usually. D got on his nerves decluttering him and throwing things away.     Gets distracted cleaning and straightening up.   Semi retired working 3 days a week. Feels better with less depression and less negative and less obsessive negative thinking.  His clinic closed bc of Covid affecting the clinic.  Still struggles getting things done bc overwhelmed with demands.  May be a little anemic and that causes some tiredness.  B12 is normal.   Riding bike.  Tend to be depressed more than he wants to be.  Can be depressed and some obsessiveness and can't let go of things.   Plan: For cost reasons he has stopped Vyvanse and switched to Adderall 30 mg 3 times daily.   Continue Lexapro 10 and lithium 300 mg daily and lamotrigine 150. No med changes today.  06/30/2020 appointment with the following noted: No covid.  Vaccinated. Overall maintaining mood.  Lonely.  Youngest D PHD psychologist and just got married.  3rd D is still in school in PHD and causes some stress.  2nd D in [place] and has GD.  Oldest D NP with [employer]  [facility].   Disc withdrawal with Adderall.  Still takes longer than he wants to do things.   Plan no med changes  12/15/2020 appointment with the following noted: Occ feels winded but cardiologist says he's OK.  Energy is variable from good to not so good.  Sleep  seems pretty good overall.  Admits he still has too much caffeine and often after 6 PM.  Would like to meet a woman. Asks about meds and tiredness. Overall mood and anxiety are OK.  Drags some in the morning wondering what to do with the day.  Good interest and enjoyment.   Plan: Continue lithium 300 mg daily.  Lexapro 10 mg daily and lamotrigine 100 mg daily. And Adderall 30 mg TID No med changes today except trial reduction in lamotrigine to 100 mg daily to see if energy is better.  07/02/2021 appointment with the following noted: Mood has been OK.  Mostly same old issues.  Need to work on negative thought process.  Still looking for savior girl friend to rescue me but realizes it's not the right way to think. Hard to keep the house clean.  18 yo grand daughter said his kitchen was a mess.   Still chronically sleepy and tired. Usually sleep average 7 hours. No unusual mood swings.  Stays busy.  Patient reports stable mood and denies irritable moods.  No manic symptoms.  Patient denies any recent difficulty with anxiety.  Patient denies difficulty with sleep initiation or maintenance. 6-8 hours sleep.   Denies appetite disturbance.  Patient reports that  motivation have been good.  Patient still has problems with concentration.  Patient denies any suicidal ideation. Plan:  no changes  10/05/21 appt noted:   More tired than he used to be.  Pending PE.  Usually chronically active.  Usually exercises.  A little worried.   Chronically scattered and difficulty getting things done.  Recognizes still hoarder.  Wonders if still depreessed but staff says he looks better.  Not hopeless or helpless.  Recognizes procrastination.   Drinks a lot of caffeine and has the Adderall.   Plans to continue cycling.    Past Psychiatric Medication Trials: Modafinil, Strattera tremor, Wellbutrin, Vyvanse, Ritalin,  Adzenys, Adderall 30 TID, Aricept off label without benefit,  lamotrigine,  Depakote tremor,   Lexapro  10  Review of Systems:  Review of Systems  Constitutional:  Positive for fatigue.  Neurological:  Negative for tremors and weakness.  Psychiatric/Behavioral:  Positive for decreased concentration.    Medications: I have reviewed the patient's current medications.  Current Outpatient Medications  Medication Sig Dispense Refill   aspirin EC 81 MG tablet Take 81 mg by mouth. occas     atorvastatin (LIPITOR) 10 MG tablet TAKE 1 TABLET (10 MG TOTAL) BY MOUTH DAILY. PLEASE CALL OFFICE FOR FURTHER REFILLS 90 tablet 0   Cholecalciferol (VITAMIN D3) 1.25 MG (50000 UT) CAPS Take 50,000 Units by mouth once a week.     Multiple Vitamin (MULTIVITAMIN) capsule Take 1 capsule by mouth daily.     naproxen sodium (ALEVE) 220 MG tablet Take 220 mg by mouth. Prn wrist pain     Omega 3 1200 MG CAPS Take by mouth. 1-2 caps daily     [START ON 11/30/2021] amphetamine-dextroamphetamine (ADDERALL) 30 MG tablet Take 1 tablet by mouth 3 (three) times daily. 90 tablet 0   [START ON 11/02/2021] amphetamine-dextroamphetamine (ADDERALL) 30 MG tablet Take 1 tablet by mouth 3 (three) times daily. 90 tablet 0   amphetamine-dextroamphetamine (ADDERALL) 30 MG tablet Take 1 tablet by mouth 3 (three) times daily. 90 tablet 0   FLUoxetine (PROZAC) 20 MG capsule Take 1 capsule (20 mg total) by mouth daily. 90 capsule 1   lamoTRIgine (LAMICTAL) 100 MG tablet Take 1 tablet (100 mg total) by mouth daily. 90 tablet 3   lithium carbonate 300 MG capsule Take 1 capsule (300 mg total) by mouth daily. (Patient not taking: Reported on 10/05/2021) 90 capsule 3   No current facility-administered medications for this visit.    Medication Side Effects: None  Allergies: No Known Allergies  Past Medical History:  Diagnosis Date   ADHD    Anxiety and depression    Vitamin D insufficiency     Family History  Problem Relation Age of Onset   Heart failure Father    Diabetes Father    Diabetes Sister    Bipolar disorder Sister    Heart  disease Brother    Hepatitis C Brother    Depression Brother     Social History   Socioeconomic History   Marital status: Divorced    Spouse name: Not on file   Number of children: 4   Years of education: Not on file   Highest education level: Not on file  Occupational History   Not on file  Tobacco Use   Smoking status: Never   Smokeless tobacco: Never  Vaping Use   Vaping Use: Never used  Substance and Sexual Activity   Alcohol use: Yes    Alcohol/week: 7.0 standard drinks    Types: 7 Shots of liquor per week    Comment: occas   Drug use: Never  Sexual activity: Not on file  Other Topics Concern   Not on file  Social History Narrative   Not on file   Social Determinants of Health   Financial Resource Strain: Not on file  Food Insecurity: Not on file  Transportation Needs: Not on file  Physical Activity: Not on file  Stress: Not on file  Social Connections: Not on file  Intimate Partner Violence: Not on file    Past Medical History, Surgical history, Social history, and Family history were reviewed and updated as appropriate.   Please see review of systems for further details on the patient's review from today.   Objective:   Physical Exam:  There were no vitals taken for this visit.  Physical Exam Constitutional:      General: He is not in acute distress. Musculoskeletal:        General: No deformity.  Neurological:     Mental Status: He is alert and oriented to person, place, and time.     Cranial Nerves: No dysarthria.     Coordination: Coordination normal.  Psychiatric:        Attention and Perception: Attention and perception normal. He does not perceive auditory or visual hallucinations.        Mood and Affect: Mood is anxious. Mood is not depressed. Affect is not labile, angry or inappropriate.        Speech: Speech normal.        Behavior: Behavior normal. Behavior is cooperative.        Thought Content: Thought content normal. Thought  content is not delusional. Thought content does not include homicidal or suicidal ideation.        Cognition and Memory: Cognition and memory normal.        Judgment: Judgment normal.     Comments: Insight good.   Chronic OCD sx incl cleaning but dreads it and procrastinates    Lab Review:     Component Value Date/Time   NA 139 11/15/2018 1235   K 4.2 11/15/2018 1235   CL 104 11/15/2018 1235   CO2 26 11/15/2018 1235   GLUCOSE 117 (H) 11/15/2018 1235   BUN 22 11/15/2018 1235   CREATININE 1.10 11/15/2018 1235   CALCIUM 9.4 11/15/2018 1235   GFRNONAA 69 11/15/2018 1235   GFRAA 80 11/15/2018 1235       Component Value Date/Time   WBC 7.4 11/15/2018 1235   RBC 4.21 11/15/2018 1235   HGB 13.0 (L) 11/15/2018 1235   HCT 39.4 11/15/2018 1235   PLT 287 11/15/2018 1235   MCV 93.6 11/15/2018 1235   MCH 30.9 11/15/2018 1235   MCHC 33.0 11/15/2018 1235   RDW 13.2 11/15/2018 1235   LYMPHSABS 1,029 11/15/2018 1235   EOSABS 104 11/15/2018 1235   BASOSABS 30 11/15/2018 1235    No results found for: POCLITH, LITHIUM   No results found for: PHENYTOIN, PHENOBARB, VALPROATE, CBMZ   .res Assessment: Plan:    Justin Marshall was seen today for follow-up and adhd.  Diagnoses and all orders for this visit:  Attention deficit hyperactivity disorder (ADHD), combined type -     amphetamine-dextroamphetamine (ADDERALL) 30 MG tablet; Take 1 tablet by mouth 3 (three) times daily. -     amphetamine-dextroamphetamine (ADDERALL) 30 MG tablet; Take 1 tablet by mouth 3 (three) times daily. -     amphetamine-dextroamphetamine (ADDERALL) 30 MG tablet; Take 1 tablet by mouth 3 (three) times daily.  Mild recurrent major depression (HCC) -     lamoTRIgine (  LAMICTAL) 100 MG tablet; Take 1 tablet (100 mg total) by mouth daily. -     FLUoxetine (PROZAC) 20 MG capsule; Take 1 capsule (20 mg total) by mouth daily.  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  Generalized anxiety disorder  Mixed obsessional thoughts and acts -     FLUoxetine (PROZAC) 20 MG capsule; Take 1 capsule (20 mg total) by mouth daily.  Excessive caffeine intake   Continue fluoxetine 20 mg daily or some depressive symptoms and obsessive compulsive traits which he thinks interferes with his overall productivity.  He has taken this medication in the past and has noticed benefit again this time..  As noted before we have examined him for bipolar symptoms repeatedly and not seen evidence for that.  However he is aware that if he has latent bipolar tendencies this could trigger them and if he has any unusual manic or hypomanic symptoms he will let us know.  Discussed side effects of SSRIs.  Disc normal dosage range.  Cont to work on getting rid of clutter. some chronic OCD tendencies.   For cost reasons he has stopped Vyvanse and switched to Adderall 30 mg 3 times daily.  He is aware this is a high dose but feels it is medically necessary.  His ADD has been severe and chronic and it is appropriate.  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    He is going to follow-up with his cardiologist as a matter of routine discussed the risk of between dosage sedation if he is not careful to space them out.  He is aware.  Reduce caffeine and none after 3 PM.   Asks about meds and sleepiness.    Discussed potential benefits, risks, and side effects of stimulants with patient to include increased heart rate, palpitations, insomnia, increased anxiety, increased irritability, or decreased appetite.  Instructed patient to contact office if experiencing any significant tolerability issues.  L-methylfolate was too expensive.  He could consider it again using good Rx but that is not convenient to him.  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    continue lamotrigine to  100 mg daily .  He plans to continue vitamin D.  He is continuing counseling with Dr. Laurell Roof. We discussed ADHD strategies.  Follow-up 4-6 months  Iona Hansen, MD, DFAPA   Please see After Visit Summary for patient specific instructions.  Future Appointments  Date Time Provider Department Center  10/21/2021 11:15 AM Yates Decamp, MD [facility] None  11/23/2021  4:00 PM Mitchum, Molly Maduro, PhD [facility] None    No orders of the defined types were placed in this encounter.      -------------------------------

## 2021-10-21 ENCOUNTER — Other Ambulatory Visit: Payer: Self-pay

## 2021-10-21 ENCOUNTER — Ambulatory Visit: Payer: Medicare HMO | Admitting: Cardiology

## 2021-10-21 ENCOUNTER — Encounter: Payer: Self-pay | Admitting: Cardiology

## 2021-10-21 VITALS — BP 126/74 | HR 80 | Temp 97.8°F | Resp 16 | Ht 68.0 in | Wt 153.9 lb

## 2021-10-21 DIAGNOSIS — Z9889 Other specified postprocedural states: Secondary | ICD-10-CM | POA: Diagnosis not present

## 2021-10-21 DIAGNOSIS — R9431 Abnormal electrocardiogram [ECG] [EKG]: Secondary | ICD-10-CM

## 2021-10-21 DIAGNOSIS — I700 Atherosclerosis of aorta: Secondary | ICD-10-CM

## 2021-10-21 DIAGNOSIS — R739 Hyperglycemia, unspecified: Secondary | ICD-10-CM | POA: Diagnosis not present

## 2021-10-21 DIAGNOSIS — R0609 Other forms of dyspnea: Secondary | ICD-10-CM

## 2021-10-21 MED ORDER — PROPRANOLOL HCL 40 MG PO TABS: 40.0000 mg | ORAL_TABLET | Freq: Three times a day (TID) | ORAL | 0 refills | Status: DC | PRN

## 2021-10-21 NOTE — Progress Notes (Signed)
Primary Physician/Referring:  Lawerance Cruel, MD  Patient ID: Justin Marshall, male    DOB: 01-Apr-1951, 71 y.o.   MRN: [id number]  No chief complaint on file.  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 Rosalyn Gess at [facility] in 2006.  In 10/16/1998 he has had a repeat coronary angiography for shortness of breath and abnormal EKG and was found to have no significant coronary artery disease with a normal ejection fraction and no evidence of mitral regurgitation.  Patient has noticed marked worsening dyspnea, at times he has obtained EKGs in his office revealing ST depressions in the inferior leads.  He has occasional twinges of pain but no exertional component.  States that he loves to exercise regularly but has not been able to, just feels his endurance is significantly reduced.  Past Medical History:  Diagnosis Date   ADHD    Anxiety and depression    Vitamin D insufficiency    Past Surgical History:  Procedure Laterality Date   MITRAL VALVE REPAIR  07/18/2005   Social History   Tobacco Use   Smoking status: Never   Smokeless tobacco: Never  Substance Use Topics   Alcohol use: Yes    Alcohol/week: 7.0 standard drinks    Types: 7 Shots of liquor per week    Comment: Occasional   Marital Status: Divorced   ROS  Review of Systems  Cardiovascular:  Positive for dyspnea on exertion. Negative for chest pain and leg swelling.  Objective   Vitals with BMI 10/21/2021 09/04/2020 05/28/2019  Height $Remov'5\' 8"'fapAmT$  $Remove'5\' 8"'yNOPTvF$  $RemoveB'5\' 8"'jpAqCNpq$   Weight 153 lbs 14 oz 159 lbs 10 oz 157 lbs 8 oz  BMI 23.41 33.38 32.91  Systolic 916 606 004  Diastolic 74 84 72  Pulse 80 89 69  Some encounter information is confidential and restricted. Go to Review Flowsheets activity to see all data.    Blood pressure 126/74, pulse 80, temperature 97.8 F (36.6 C), temperature source Temporal, resp.  rate 16, height $RemoveBe'5\' 8"'QSjFIBIYc$  (1.727 m), weight 153 lb 14.4 oz (69.8 kg), SpO2 99 %. Body mass index is 23.4 kg/m.   Physical Exam Neck:     Vascular: No JVD.  Cardiovascular:     Rate and Rhythm: Normal rate and regular rhythm.     Pulses: Intact distal pulses.     Heart sounds: Normal heart sounds. No murmur heard.   No gallop.  Pulmonary:     Effort: Pulmonary effort is normal.     Breath sounds: Normal breath sounds.  Abdominal:     General: Bowel sounds are normal.     Palpations: Abdomen is soft.  Musculoskeletal:     Right lower leg: No edema.     Left lower leg: No edema.   Radiology:  Chest x-ray PA and lateral view 11/15/2018: The heart size and mediastinal contours are within normal limits. Both lungs are clear. The visualized skeletal structures are unremarkable. Wires suture and clips anterior right chest. Aortic atherosclerosis.  Laboratory examination:    External Labs:     TSH No results for input(s): TSH in the last 8760 hours.  Labs 09/21/2021:  Hb 13.2/HCT 39.0, platelets 261, normal indicis.  A1c 5.8%.  Labs 06/09/2021:  Total cholesterol 164, triglycerides 59, HDL 71, LDL 79.  Non-HDL cholesterol 93.  Serum glucose 106 mg, BUN 29, creatinine 0.78, EGFR  97 mL, LFTs normal.   Medications and Allergies   No Known Allergies    Current Outpatient Medications:    [START ON 11/30/2021] amphetamine-dextroamphetamine (ADDERALL) 30 MG tablet, Take 1 tablet by mouth 3 (three) times daily., Disp: 90 tablet, Rfl: 0   aspirin 325 MG EC tablet, Take 325 mg by mouth as needed for pain., Disp: , Rfl:    atorvastatin (LIPITOR) 10 MG tablet, TAKE 1 TABLET (10 MG TOTAL) BY MOUTH DAILY. PLEASE CALL OFFICE FOR FURTHER REFILLS, Disp: 90 tablet, Rfl: 0   Cholecalciferol (VITAMIN D3) 1.25 MG (50000 UT) CAPS, Take 50,000 Units by mouth once a week., Disp: , Rfl:    FLUoxetine (PROZAC) 20 MG capsule, Take 1 capsule (20 mg total) by mouth daily., Disp: 90 capsule, Rfl: 1    lamoTRIgine (LAMICTAL) 100 MG tablet, Take 1 tablet (100 mg total) by mouth daily., Disp: 90 tablet, Rfl: 3   Multiple Vitamin (MULTIVITAMIN) capsule, Take 1 capsule by mouth daily., Disp: , Rfl:    naproxen (NAPROSYN) 500 MG tablet, Take 1 tablet by mouth as needed., Disp: , Rfl:    Omega 3 1200 MG CAPS, Take by mouth. 1-2 caps daily, Disp: , Rfl:    propranolol (INDERAL) 40 MG tablet, Take 1 tablet (40 mg total) by mouth 3 (three) times daily as needed (Prior to CT angiogram)., Disp: 10 tablet, Rfl: 0   Radiology   Chest x-ray PA and lateral view 11/15/2018: The heart size and mediastinal contours are within normal limits. Both lungs are clear. The visualized skeletal structures are  unremarkable. Wires suture and clips anterior right chest. Aortic atherosclerosis.  Cardiac Studies:   Echocardiogram 09/12/2017: Left ventricle cavity is normal in size. Normal global wall motion. Normal diastolic filling pattern. Calculated EF 70%. Stable mitral valve repair.  Mild mitral regurgitation. No evidence of pulmonary hypertension. No significant change compared to prior study in 2015.  Treadmill exercise stress test 09/12/2017: Indication: Shortness of breath, H/O Mitral valve repair.  The patient exercised on Bruce protocol for 12:00 min. Patient achieved  11.17 METS and reached HR  167 bpm, which is  107 % of maximum age-predicted HR.  Stress test terminated due to fatigue.  Resting EKG demonstrates Normal sinus rhythm. ST Changes: With peak exercise there was no ST-T changes of ischemia. Arrhythmias: none. BP Response to Exercise: Normal resting BP- appropriate response. Exercise capacity was above average for age. Continue primary/secondary prevention.  EKG:  EKG 10/22/2021: Normal sinus rhythm at the rate of 67 bpm, left atrial enlargement, normal axis.  No evidence of ischemia.  Baseline artifact.  Compared to  09/21/2021: Normal sinus rhythm at rate of 78 bpm, inferior nonspecific ST  segment depression, cannot exclude ischemia.  Assessment     ICD-10-CM   1. Dyspnea on exertion  R06.09 EKG 12-Lead    PCV ECHOCARDIOGRAM COMPLETE    CT CORONARY MORPH W/CTA COR W/SCORE W/CA W/CM &/OR WO/CM    propranolol (INDERAL) 40 MG tablet    2. Aortic atherosclerosis (HCC)  I70.0 PCV ECHOCARDIOGRAM COMPLETE    CT CORONARY MORPH W/CTA COR W/SCORE W/CA W/CM &/OR WO/CM    3. H/O mitral valve repair  Z98.890     4. Hyperglycemia  R73.9     5. 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 Rosalyn Gess at [facility] in 2006.  In 10/16/1998 he has had a repeat coronary angiography for shortness of breath and abnormal EKG and was found to have no significant coronary artery disease with a normal ejection fraction and no evidence of mitral regurgitation.  Patient has noticed marked worsening dyspnea, at times he has obtained EKGs in his office revealing ST depressions in the inferior leads.  He is concerned about ongoing dyspnea and EKG abnormality.  I will schedule him for coronary CT angiogram.  We will repeat echocardiogram to follow-up on valvular heart disease.  I reviewed his labs, mild hyperglycemia, lipids are under excellent control and blood pressure is well controlled.  There is no clinical evidence of heart failure.  I will see him back in 6 weeks unless coronary CTA is abnormal.  I have reassured him.    Adrian Prows, MD, [facility] 10/21/2021, 12:52 PM Office: [phone] Pager: [phone]

## 2021-10-28 ENCOUNTER — Other Ambulatory Visit: Payer: Self-pay

## 2021-10-28 ENCOUNTER — Ambulatory Visit: Payer: Medicare HMO

## 2021-10-28 DIAGNOSIS — R9431 Abnormal electrocardiogram [ECG] [EKG]: Secondary | ICD-10-CM | POA: Diagnosis not present

## 2021-10-28 DIAGNOSIS — R0609 Other forms of dyspnea: Secondary | ICD-10-CM | POA: Diagnosis not present

## 2021-10-28 DIAGNOSIS — I700 Atherosclerosis of aorta: Secondary | ICD-10-CM

## 2021-10-30 ENCOUNTER — Encounter: Payer: Self-pay | Admitting: Cardiology

## 2021-11-02 ENCOUNTER — Other Ambulatory Visit: Payer: Self-pay

## 2021-11-02 ENCOUNTER — Telehealth: Payer: Self-pay | Admitting: Psychiatry

## 2021-11-02 DIAGNOSIS — F902 Attention-deficit hyperactivity disorder, combined type: Secondary | ICD-10-CM

## 2021-11-02 MED ORDER — AMPHETAMINE-DEXTROAMPHETAMINE 30 MG PO TABS: 30.0000 mg | ORAL_TABLET | Freq: Three times a day (TID) | ORAL | 0 refills | Status: DC

## 2021-11-02 NOTE — Telephone Encounter (Signed)
Pt called and said that he needs a script of adderall 30 mg [facility] [address] They have it in stock ?

## 2021-11-02 NOTE — Telephone Encounter (Signed)
Pended.

## 2021-11-03 ENCOUNTER — Telehealth: Payer: Self-pay | Admitting: Psychiatry

## 2021-11-03 ENCOUNTER — Other Ambulatory Visit: Payer: Self-pay

## 2021-11-03 DIAGNOSIS — F902 Attention-deficit hyperactivity disorder, combined type: Secondary | ICD-10-CM

## 2021-11-03 MED ORDER — AMPHETAMINE-DEXTROAMPHETAMINE 30 MG PO TABS: 30.0000 mg | ORAL_TABLET | Freq: Three times a day (TID) | ORAL | 0 refills | Status: DC

## 2021-11-03 NOTE — Telephone Encounter (Signed)
Justin Marshall called and said that the RX for Adderall 30 mg [facility] [address] [facility] [place] can't be filled until April. He is totally out of his Adderall and has been for 4 days. Please send a new RX for Adderall 30 mg to: ? ? [id number] [place] [place] - [address] ? ?Phone:  [phone]  ?Fax:  [phone]  ? ? ?They have Adderall in stock at above pharmacy.  ?

## 2021-11-03 NOTE — Telephone Encounter (Signed)
Pended.

## 2021-11-06 ENCOUNTER — Telehealth (HOSPITAL_COMMUNITY): Payer: Self-pay | Admitting: *Deleted

## 2021-11-06 NOTE — Telephone Encounter (Signed)
Attempted to call patient regarding upcoming cardiac CT appointment. °Left message on voicemail with name and callback number ° °Micaiah Remillard RN Navigator Cardiac Imaging °[facility] °[phone] Office °[phone] Cell ° °

## 2021-11-09 ENCOUNTER — Other Ambulatory Visit: Payer: Self-pay

## 2021-11-09 ENCOUNTER — Encounter (HOSPITAL_COMMUNITY): Payer: Self-pay

## 2021-11-09 ENCOUNTER — Ambulatory Visit (HOSPITAL_COMMUNITY)
Admission: RE | Admit: 2021-11-09 | Discharge: 2021-11-09 | Disposition: A | Discharge status: Home / Self Care | Payer: Medicare HMO | Source: Ambulatory Visit | Attending: Family Medicine | Admitting: Family Medicine

## 2021-11-09 DIAGNOSIS — R0609 Other forms of dyspnea: Secondary | ICD-10-CM | POA: Insufficient documentation

## 2021-11-09 DIAGNOSIS — I700 Atherosclerosis of aorta: Secondary | ICD-10-CM | POA: Insufficient documentation

## 2021-11-09 DIAGNOSIS — R9431 Abnormal electrocardiogram [ECG] [EKG]: Secondary | ICD-10-CM | POA: Diagnosis not present

## 2021-11-09 MED ORDER — NITROGLYCERIN 0.4 MG SL SUBL
SUBLINGUAL_TABLET | SUBLINGUAL | Status: AC
  Filled 2021-11-09: qty 2

## 2021-11-09 MED ORDER — IOHEXOL 350 MG/ML SOLN
100.0000 mL | Freq: Once | INTRAVENOUS | Status: AC | PRN
  Administered 2021-11-09: 100 mL via INTRAVENOUS

## 2021-11-09 MED ORDER — NITROGLYCERIN 0.4 MG SL SUBL
0.8000 mg | SUBLINGUAL_TABLET | Freq: Once | SUBLINGUAL | Status: AC
Start: 2021-11-09 — End: 2021-11-09
  Administered 2021-11-09: 0.8 mg via SUBLINGUAL

## 2021-11-09 NOTE — Progress Notes (Signed)
Coronary CTA 11/09/2021: ?1. Atherosclerosis of the descending thoracic aorta. ?2. Cystic structure of the upper liver has a benign appearance. ?Coronary CTA pending

## 2021-11-11 NOTE — Progress Notes (Signed)
Labs 11/06/2021: ? ?Total cholesterol 115, triglycerides 47, HDL 82, LDL 60.  BUN 28, creatinine 0.79, EGFR 96,, potassium 4.5, LFTs normal. ? ?A1c 5.6%. ? ?Hb 12.7/HCT 38.0, platelets 316, normal indicis.

## 2021-11-12 NOTE — Progress Notes (Signed)
Coronary CTA 11/09/2021: ?1. Atherosclerosis of the descending thoracic aorta. ?2. Cystic structure of the upper liver has a benign appearance. ?3. Coronary calcium score 0. ?4. Normal coronary origin, right dominant.  Normal coronary arteries. ?5. Mitral valve ring is well-seated without evidence of dehiscence. ?6. Consider non-atherosclerotic causes of dyspnea.

## 2021-11-23 ENCOUNTER — Ambulatory Visit (INDEPENDENT_AMBULATORY_CARE_PROVIDER_SITE_OTHER): Payer: Medicare HMO | Admitting: Psychiatry

## 2021-11-23 DIAGNOSIS — F411 Generalized anxiety disorder: Secondary | ICD-10-CM | POA: Diagnosis not present

## 2021-11-23 DIAGNOSIS — F330 Major depressive disorder, recurrent, mild: Secondary | ICD-10-CM

## 2021-11-23 DIAGNOSIS — F902 Attention-deficit hyperactivity disorder, combined type: Secondary | ICD-10-CM

## 2021-11-23 DIAGNOSIS — Z789 Other specified health status: Secondary | ICD-10-CM | POA: Diagnosis not present

## 2021-11-23 DIAGNOSIS — R69 Illness, unspecified: Secondary | ICD-10-CM | POA: Diagnosis not present

## 2021-11-23 DIAGNOSIS — F423 Hoarding disorder: Secondary | ICD-10-CM | POA: Diagnosis not present

## 2021-11-23 NOTE — Progress Notes (Signed)
Psychotherapy Progress Note [facility] Marliss Czar, PhD LP  Patient ID: Justin Marshall)    MRN: [id number] Therapy format: Individual psychotherapy Date: 11/23/2021      Start: 4:19p     Stop: 5:02p     Time Spent: 43 min Location: Telehealth visit -- I connected with this patient by an approved telecommunication method (audio only), with his informed consent, and verifying identity and patient privacy.  I was located at my office and patient at his home.  As needed, we discussed the limitations, risks, and security and privacy concerns associated with telehealth service, including the availability and conditions which currently govern in-person appointments and the possibility that 3rd-party payment may not be fully guaranteed and he may be responsible for charges.  After he indicated understanding, we proceeded with the session.  Also discussed treatment planning, as needed, including ongoing verbal agreement with the plan, the opportunity to ask and answer all questions, his demonstrated understanding of instructions, and his readiness to call the office should symptoms worsen or he feels he is in a crisis state and needs more immediate and tangible assistance.   Session narrative (presenting needs, interim history, self-report of stressors and symptoms, applications of prior therapy, status changes, and interventions made in session) Converted to phone appt for PT running late after transporting his sister.  Harried, sometimes difficult to focus.  Reception bad, suspended conversation until he could unhook his fax from his land line and use land line.  Call back to [phone].    Still struggles with personal organization, clutter, using his iPhone correctly, keeping laundry.  Wanted to make more progress before seeing [place] again.  Doing better with countertops than he used to.  Bedroom much cleaner, quick to get back in a mess but better.  Hard-won insight that he can  bite off too much and get demoralized.  Aware that he has an old, strong tendency to fit one more thing into his outlook for spending time and sidetrack mercilessly sometimes.  Re. 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  Has been reducing coffee, still gets a fair amount of caffeine, still goes well into the evening.    Affirmed insights and supportively confronted to dig in on tactics for sleep protection and focus/productivity.  Urged to institute a caffeine curfew, no matter what the thinks about being "able" to go to sleep easily enough (effective sleep shortage, not immunity to caffeine).  Oriented to electronic displays' night setting and instructed in use.  Oriented to the Pomodoro technique, which is essentially about setting a timer and being all in on one task for a designated time, say, 10-25 minutes, then changing focus for a few.  Agrees, will try.  Therapeutic modalities: Cognitive Behavioral Therapy and Solution-Oriented/Positive Psychology  Mental Status/Observations:  Appearance:   Casual     Behavior:  Appropriate  Motor:  Normal  Speech/Language:   Fluent, some halting  Affect:  Appropriate  Mood:  anxious and dysthymic  Thought process:  Some tangents  Thought content:    Tangential  Sensory/Perceptual disturbances:    WNL  Orientation:  Fully oriented  Attention:  Good    Concentration:  Fair  Memory:  grossly intact  Insight:    Good  Judgment:   Good  Impulse Control:  Fair   Risk Assessment: Danger to Self: No Self-injurious Behavior: No Danger to Others:  No Physical Aggression / Violence: No Duty to Warn: No Access to Firearms a concern: No  Assessment of progress:  stabilized  Diagnosis:   ICD-10-CM   1. Attention deficit hyperactivity disorder (ADHD), combined type  F90.2     2. Mild recurrent major depression (HCC)  F33.0     3. Excessive caffeine intake  Z78.9      4. 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 Generalized anxiety disorder  F41.1      Plan:  Set night displays on available electronics -- make display go to sunset colors at least 2-3 hrs before intended bedtime Back up last caffeine to at least 6pm, preferably 2pm Try the Pomodoro technique -- set a timer for 10-25 minutes, whatever you can stick with, to just do one thing you intended, no side trips, all in, then 2-5 minutes break to do anything else before doing another trip Continue catching shame/blame in action and try to just do the next decent thing with available time Other recommendations/advice as may be noted above Continue to utilize previously learned skills ad lib Maintain medication as prescribed and work faithfully with relevant prescriber(s) if any changes are desired or seem indicated Call the clinic on-call service, 988/hotline, 911, or present to [facility] or ER if any life-threatening psychiatric crisis Return for time as available, prefer mid-May. Already scheduled visit in this office Visit date not found.  Robley Fries, PhD Marliss Czar, PhD LP Clinical Psychologist, [facility] [address] [place], [place] [address] [phone]

## 2021-11-23 NOTE — Patient Instructions (Signed)
Three main ideas for today: ? ?Set night displays on available electronics -- make display go to sunset colors at least 2-3 hrs before intended bedtime ?Try to back up last caffeine before 6pm ?Try the Pomodoro technique -- set a timer for 10-25 minutes, whatever you can stick with, to just do one thing you intended, no side trips, all in, then 2-5 minutes break to do anything else before doing another trip ?Continue catching shame/blame in action and try to just do the next decent thing ?

## 2021-12-02 ENCOUNTER — Ambulatory Visit: Payer: Medicare HMO | Admitting: Cardiology

## 2021-12-02 ENCOUNTER — Encounter: Payer: Self-pay | Admitting: Cardiology

## 2021-12-02 VITALS — BP 130/80 | HR 83 | Temp 97.3°F | Resp 16 | Ht 68.0 in | Wt 155.0 lb

## 2021-12-02 DIAGNOSIS — R0609 Other forms of dyspnea: Secondary | ICD-10-CM

## 2021-12-02 DIAGNOSIS — I700 Atherosclerosis of aorta: Secondary | ICD-10-CM

## 2021-12-02 DIAGNOSIS — Z9889 Other specified postprocedural states: Secondary | ICD-10-CM | POA: Diagnosis not present

## 2021-12-02 NOTE — Progress Notes (Signed)
? ?Primary Physician/Referring:  Lawerance Cruel, MD ? ?Patient ID: Justin Marshall, male    DOB: 02-02-1951, 71 y.o.   MRN: [id number] ? ?Chief Complaint  ?Patient presents with  ? Dyspnea on exertion  ? Follow-up  ?  6 weeks  ? ?HPI:   ?  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 Sharlot Gowda at [facility] in 2006.  In 10/16/1998 he has had a repeat coronary angiography for shortness of breath and abnormal EKG and was found to have no significant coronary artery disease with a normal ejection fraction and no evidence of mitral regurgitation. ? ?Due to dyspnea on exertion, I performed coronary CT angiogram and also echocardiogram. At times he has obtained EKGs in his office revealing ST depressions in the inferior leads.  No new symptomatology, he just came back from a vacation and states that he had done well.  No chest pain or palpitations. ? ?Past Medical History:  ?Diagnosis Date  ? ADHD   ? Anxiety and depression   ? Hyperlipidemia   ? Vitamin D insufficiency   ? ?Past Surgical History:  ?Procedure Laterality Date  ? MITRAL VALVE REPAIR  07/18/2005  ? ?Social History  ? ?Tobacco Use  ? Smoking status: Never  ? Smokeless tobacco: Never  ?Substance Use Topics  ? Alcohol use: Yes  ?  Alcohol/week: 7.0 standard drinks  ?  Types: 7 Shots of liquor per week  ?  Comment: Occasional  ? Marital Status: Divorced  ? ?ROS  ?Review of Systems  ?Cardiovascular:  Positive for dyspnea on exertion. Negative for chest pain and leg swelling.  ?Objective  ? ? ?  12/02/2021  ?  3:07 PM 12/02/2021  ?  3:03 PM 11/09/2021  ?  2:51 PM  ?Vitals with BMI  ?Height  $Remove'5\' 8"'CjPTDhr$    ?Weight  155 lbs   ?BMI  23.57   ?Systolic 356 861 683  ?Diastolic 80 80 64  ?Pulse 83 83 54  ?  ?Blood pressure 130/80, pulse 83, temperature (!) 97.3 ?F (36.3 ?C), temperature source Temporal, resp. rate 16, height $RemoveBe'5\' 8"'ykKuXvjLz$  (1.727 m), weight 155 lb  (70.3 kg), SpO2 100 %. Body mass index is 23.57 kg/m?. ?  ?Physical Exam ?Neck:  ?   Vascular: No JVD.  ?Cardiovascular:  ?   Rate and Rhythm: Normal rate and regular rhythm.  ?   Pulses: Intact distal pulses.  ?   Heart sounds: Normal heart sounds. No murmur heard. ?  No gallop.  ?Pulmonary:  ?   Effort: Pulmonary effort is normal.  ?   Breath sounds: Normal breath sounds.  ?Abdominal:  ?   General: Bowel sounds are normal.  ?   Palpations: Abdomen is soft.  ?Musculoskeletal:  ?   Right lower leg: No edema.  ?   Left lower leg: No edema.  ? ?Radiology: ? ?Chest x-ray PA and lateral view 11/15/2018: ?The heart size and mediastinal contours are within normal limits. Both lungs are clear. The visualized skeletal structures are ?unremarkable. Wires suture and clips anterior right chest. Aortic atherosclerosis. ? ?Laboratory examination:  ? ?External Labs:   ? ?Labs 11/06/2021: ?Total cholesterol 115, triglycerides 47, HDL 82, LDL 60.  BUN 28, creatinine 0.79, EGFR 96, potassium 4.5, LFTs normal. ?A1c 5.6%. ?Hb 12.7/HCT 38.0, platelets 316, normal indicis. ? ?Labs 09/21/2021: ?Hb 13.2/HCT 39.0, platelets 261, normal indicis. ?A1c 5.8%. ? ?  Labs 06/09/2021: ?Total cholesterol 164, triglycerides 59, HDL 71, LDL 79.  Non-HDL cholesterol 93. ?Serum glucose 106 mg, BUN 29, creatinine 0.78, EGFR 97 mL, LFTs normal. ? ? ?Medications and Allergies  ? ?No Known Allergies  ? ? ?Current Outpatient Medications:  ?  amphetamine-dextroamphetamine (ADDERALL) 30 MG tablet, Take 1 tablet by mouth 3 (three) times daily., Disp: 90 tablet, Rfl: 0 ?  atorvastatin (LIPITOR) 10 MG tablet, TAKE 1 TABLET (10 MG TOTAL) BY MOUTH DAILY. PLEASE CALL OFFICE FOR FURTHER REFILLS, Disp: 90 tablet, Rfl: 0 ?  Cholecalciferol (VITAMIN D3) 1.25 MG (50000 UT) CAPS, Take 50,000 Units by mouth once a week., Disp: , Rfl:  ?  FLUoxetine (PROZAC) 20 MG capsule, Take 1 capsule (20 mg total) by mouth daily., Disp: 90 capsule, Rfl: 1 ?  lamoTRIgine (LAMICTAL) 100 MG  tablet, Take 1 tablet (100 mg total) by mouth daily., Disp: 90 tablet, Rfl: 3 ?  Multiple Vitamin (MULTIVITAMIN) capsule, Take 1 capsule by mouth daily., Disp: , Rfl:  ?  naproxen (NAPROSYN) 500 MG tablet, Take 1 tablet by mouth as needed., Disp: , Rfl:  ?  Omega 3 1200 MG CAPS, Take by mouth. 1-2 caps daily, Disp: , Rfl:  ? ? ?Radiology  ? ?Chest x-ray PA and lateral view 11/15/2018: ?The heart size and mediastinal contours are within normal limits. ?Both lungs are clear. The visualized skeletal structures are  ?unremarkable. Wires suture and clips anterior right chest. Aortic atherosclerosis. ? ?Cardiac Studies:  ? ?Treadmill exercise stress test 09/12/2017: ?Indication: Shortness of breath, H/O Mitral valve repair.  ?The patient exercised on Bruce protocol for 12:00 min. Patient achieved  11.17 METS and reached HR  167 bpm, which is  107 % of maximum age-predicted HR.  Stress test terminated due to fatigue.  Resting EKG demonstrates Normal sinus rhythm. ST Changes: With peak exercise there was no ST-T changes of ischemia. Arrhythmias: none. BP Response to Exercise: Normal resting BP- appropriate response. Exercise capacity was above average for age. Continue primary/secondary prevention. ? ?Echocardiogram 10/28/2021:  ?Normal LV systolic function with visual EF 55-60%. Left ventricle cavity  ?is normal in size. Normal left ventricular wall thickness. Normal global  ?wall motion. Unable to evaluate diastolic function due to mitral valve  ?repair. Normal LAP.  ?S/P Mitral valve repair, valvuloplasty ring well seated, without evidence  ?of dehiscence, no perivalvular regurgitation. No evidence of mitral  ?stenosis. Mild (Grade I) mitral regurgitation.  ?Compared to study 09/02/2017 no significant change.  ? ?Coronary CTA 11/09/2021: ?1. Atherosclerosis of the descending thoracic aorta. ?2. Cystic structure of the upper liver has a benign appearance. ?3. Coronary calcium score 0. ?4. Normal coronary origin, right  dominant.  Normal coronary arteries. ?5. Mitral valve ring is well-seated without evidence of dehiscence. ?6. Consider non-atherosclerotic causes of dyspnea. ? ?EKG: ? ?EKG 10/22/2021: Normal sinus rhythm at the rate of 67 bpm, left atrial enlargement, normal axis.  No evidence of ischemia.  Baseline artifact.  Compared to  09/21/2021: Normal sinus rhythm at rate of 78 bpm, inferior nonspecific ST segment depression, cannot exclude ischemia. ? ?Assessment  ? ?  ICD-10-CM   ?1. Dyspnea on exertion  R06.09   ?  ?2. H/O mitral valve repair  [phone]   ?  ?3. Aortic atherosclerosis (HCC)  I70.0   ?  ?  ?Recommendations:  ? 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 Sharlot Gowda at  [facility] in 2006.  In 10/16/1998 he has had a repeat coronary angiography for shortness of breath and abnormal EKG and was found to have no significant coronary artery disease with a normal ejection fraction and no evidence of mitral regurgitation. ? ?Due to dyspnea on exertion, I performed coronary CT angiogram and also echocardiogram.  I reviewed them with him extensively.  In spite of his age of 27 years, although he has mild aortic atherosclerosis by chest x-ray, CT scan does not reveal any coronary calcification with a calcium score of 0.  Also echocardiogram is essentially normal.  Hence advised him that his likelihood of having a acute vascular event is very unlikely as this is factors are well controlled especially hyperlipidemia and he continues to remain active.  With regard to mitral valve disease, he has had excellent repair of mitral valve.  No changes in the medications were done today.  I simply reassured him.  I will see him back in 2 years.  ? ?Advised him that his dyspnea on exertion is probably age-appropriate deconditioning. ? ? ?Adrian Prows, MD, [facility] ?12/02/2021, 5:06 PM ?Office: [phone] ?Pager:  [phone]  ?

## 2021-12-24 ENCOUNTER — Other Ambulatory Visit: Payer: Self-pay | Admitting: Cardiology

## 2021-12-24 DIAGNOSIS — I700 Atherosclerosis of aorta: Secondary | ICD-10-CM

## 2021-12-29 ENCOUNTER — Telehealth: Payer: Self-pay | Admitting: Psychiatry

## 2021-12-29 NOTE — Telephone Encounter (Signed)
Patient rtc regarding previous message. [facility] [address] [facility] Sustained [facility]. Would like prescription sent there. Ph: [phone] [facility] [address] [place]  ?

## 2021-12-29 NOTE — Telephone Encounter (Signed)
[facility] Pt to advise which location after 3 pm today.When shipment arrives. ?

## 2021-12-30 ENCOUNTER — Other Ambulatory Visit: Payer: Self-pay | Admitting: Psychiatry

## 2021-12-30 DIAGNOSIS — F902 Attention-deficit hyperactivity disorder, combined type: Secondary | ICD-10-CM

## 2021-12-30 MED ORDER — AMPHETAMINE-DEXTROAMPHETAMINE 30 MG PO TABS: 30.0000 mg | ORAL_TABLET | Freq: Three times a day (TID) | ORAL | 0 refills | Status: DC

## 2021-12-30 NOTE — Telephone Encounter (Signed)
Sent prescription for regular Adderall 30 mg 3 times daily to [facility] [address] ?

## 2022-02-01 ENCOUNTER — Telehealth: Payer: Self-pay | Admitting: Psychiatry

## 2022-02-01 ENCOUNTER — Other Ambulatory Visit: Payer: Self-pay

## 2022-02-01 DIAGNOSIS — F902 Attention-deficit hyperactivity disorder, combined type: Secondary | ICD-10-CM

## 2022-02-01 MED ORDER — AMPHETAMINE-DEXTROAMPHETAMINE 30 MG PO TABS: 30.0000 mg | ORAL_TABLET | Freq: Three times a day (TID) | ORAL | 0 refills | Status: DC

## 2022-02-01 NOTE — Telephone Encounter (Signed)
Pended.

## 2022-02-01 NOTE — Telephone Encounter (Signed)
Pt called at 9:34 am asking for a refill on his adderall 30 mg . [facility] [place] in stock.Next appt 8/8

## 2022-02-02 DIAGNOSIS — H04123 Dry eye syndrome of bilateral lacrimal glands: Secondary | ICD-10-CM | POA: Diagnosis not present

## 2022-02-02 DIAGNOSIS — Z961 Presence of intraocular lens: Secondary | ICD-10-CM | POA: Diagnosis not present

## 2022-02-02 DIAGNOSIS — H43813 Vitreous degeneration, bilateral: Secondary | ICD-10-CM | POA: Diagnosis not present

## 2022-02-02 DIAGNOSIS — H353131 Nonexudative age-related macular degeneration, bilateral, early dry stage: Secondary | ICD-10-CM | POA: Diagnosis not present

## 2022-02-23 ENCOUNTER — Ambulatory Visit (INDEPENDENT_AMBULATORY_CARE_PROVIDER_SITE_OTHER): Payer: Medicare HMO | Admitting: Psychiatry

## 2022-02-23 DIAGNOSIS — F423 Hoarding disorder: Secondary | ICD-10-CM | POA: Diagnosis not present

## 2022-02-23 DIAGNOSIS — F411 Generalized anxiety disorder: Secondary | ICD-10-CM

## 2022-02-23 DIAGNOSIS — F902 Attention-deficit hyperactivity disorder, combined type: Secondary | ICD-10-CM | POA: Diagnosis not present

## 2022-02-23 DIAGNOSIS — F330 Major depressive disorder, recurrent, mild: Secondary | ICD-10-CM | POA: Diagnosis not present

## 2022-02-23 DIAGNOSIS — Z789 Other specified health status: Secondary | ICD-10-CM

## 2022-02-23 DIAGNOSIS — R69 Illness, unspecified: Secondary | ICD-10-CM | POA: Diagnosis not present

## 2022-02-23 NOTE — Progress Notes (Signed)
Psychotherapy Progress Note [facility] Marliss Czar, PhD LP  Patient ID: [place])    MRN: [id number] Therapy format: Individual psychotherapy Date: 02/23/2022      Start: 2:16p     Stop: 3:02p     Time Spent: 46 min Location: In-person   Session narrative (presenting needs, interim history, self-report of stressors and symptoms, applications of prior therapy, status changes, and interventions made in session) Over 3 months since last seen.  C/o being tired a lot, then goes into story of hanging out with a friend in [employer] games last week, where his beloved [employer] was in the championship.  Even though he complains of being tired, and sleepy too much, fidgety right now, admits taking his 3rd Adderall early, and having had a 200mg  caffeine drink (energy drink?), and  Says he's been getting 7 hrs sleep, but allows that caffeine habit may be wearing on him.  Very recently got to the gym, after a false start getting to the gym then napping 45 min in the car.  Describes sxs of insulin sensitivity, incl. crashes of energy, chills sometimes, inconvenient need for naps, and caffeine really doesn't do the trick.  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  Does find he can get a freshen up by intentionally breathing bigger, suggesting he may at times retain CO2 from motor habit and tension.  Also finding that he goes straight to REM sleep when napping, before even losing consciousness, strongly suggesting REM deprivation.  Advised strongly on caffeine curfew, adhering to dose timing for controlled substance, and prioritizing sleep protection and circadian rhythm for best clarity, concentration, judgment, and health.    Therapeutic modalities: Cognitive Behavioral Therapy and Solution-Oriented/Positive Psychology  Mental Status/Observations:  Appearance:   Casual     Behavior:  Appropriate  Motor:  fidgeting   Speech/Language:   fluent  Affect:  Appropriate  Mood:  restless  Thought process:  normal  Thought content:    varied  Sensory/Perceptual disturbances:    WNL  Orientation:  Fully oriented  Attention:  Good    Concentration:  Fair  Memory:  WNL  Insight:    Good  Judgment:   Good  Impulse Control:  Fair   Risk Assessment: Danger to Self: No Self-injurious Behavior: No Danger to Others: No Physical Aggression / Violence: No Duty to Warn: No Access to Firearms a concern: No  Assessment of progress:  stabilized  Diagnosis:   ICD-10-CM   1. Attention deficit hyperactivity disorder (ADHD), combined type  F90.2     2. Excessive caffeine intake -- r/o dependence  Z78.9     3. Generalized anxiety disorder  F41.1     4. Mild recurrent major depression (HCC)  F33.0     5. 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  F42.3      Plan:  Stimulant management -- less caffeine (incl energy drinks), curfew at least 6 hrs ahead of bed, make sure to manage Adderall dosing to ensure it does not go above recommendations and wears off for bedtime Alternative energizing tactics -- wakeful breathing techniques, try MCT oil for hitting the wall by daytime to test metabolic theory Sleep hygiene -- set night displays on available electronics for 2-3 hours before intended bedtime, authoritatively decide to let go of the day's unfinished business at a reasonable hour Productivity in tasks -- Pomodoro technique Morale/self-esteem -- acknowledge shame/blame in  progress and affirm just doing the next decent thing Other recommendations/advice as may be noted above Continue to utilize previously learned skills ad lib Maintain medication as prescribed and work faithfully with relevant prescriber(s) if any changes are desired or seem indicated Call the clinic on-call service, 988/hotline, 911, or present to [facility] or ER if any life-threatening psychiatric crisis Return for  time at discretion. Already scheduled visit in this office 04/06/2022.  Robley Fries, PhD Marliss Czar, PhD LP Clinical Psychologist, [facility] [address] [place], [place] [address] [phone]

## 2022-04-01 ENCOUNTER — Other Ambulatory Visit: Payer: Self-pay | Admitting: Cardiology

## 2022-04-01 DIAGNOSIS — I700 Atherosclerosis of aorta: Secondary | ICD-10-CM

## 2022-04-02 ENCOUNTER — Telehealth: Payer: Self-pay | Admitting: Psychiatry

## 2022-04-02 ENCOUNTER — Other Ambulatory Visit: Payer: Self-pay

## 2022-04-02 DIAGNOSIS — F902 Attention-deficit hyperactivity disorder, combined type: Secondary | ICD-10-CM

## 2022-04-02 NOTE — Telephone Encounter (Signed)
Cancelled and pended  

## 2022-04-02 NOTE — Telephone Encounter (Signed)
Justin [place] called at 4:25 because his adderall is not available at the [facility] [place].  He called and they have it at the [facility] on [address],  Will you please send it there.

## 2022-04-05 MED ORDER — AMPHETAMINE-DEXTROAMPHETAMINE 30 MG PO TABS: 30.0000 mg | ORAL_TABLET | Freq: Three times a day (TID) | ORAL | 0 refills | Status: DC

## 2022-04-05 NOTE — Telephone Encounter (Signed)
Please send adderall to [facility] on [address]. He is out of his meds all weekend

## 2022-04-05 NOTE — Telephone Encounter (Signed)
Please approve

## 2022-04-06 ENCOUNTER — Ambulatory Visit (INDEPENDENT_AMBULATORY_CARE_PROVIDER_SITE_OTHER): Payer: Medicare HMO | Admitting: Psychiatry

## 2022-04-06 ENCOUNTER — Encounter: Payer: Self-pay | Admitting: Psychiatry

## 2022-04-06 ENCOUNTER — Telehealth: Payer: Self-pay

## 2022-04-06 VITALS — BP 125/78 | HR 93

## 2022-04-06 DIAGNOSIS — F422 Mixed obsessional thoughts and acts: Secondary | ICD-10-CM

## 2022-04-06 DIAGNOSIS — F411 Generalized anxiety disorder: Secondary | ICD-10-CM | POA: Diagnosis not present

## 2022-04-06 DIAGNOSIS — R69 Illness, unspecified: Secondary | ICD-10-CM | POA: Diagnosis not present

## 2022-04-06 DIAGNOSIS — F902 Attention-deficit hyperactivity disorder, combined type: Secondary | ICD-10-CM

## 2022-04-06 DIAGNOSIS — F330 Major depressive disorder, recurrent, mild: Secondary | ICD-10-CM | POA: Diagnosis not present

## 2022-04-06 DIAGNOSIS — F423 Hoarding disorder: Secondary | ICD-10-CM | POA: Diagnosis not present

## 2022-04-06 MED ORDER — AMPHETAMINE-DEXTROAMPHETAMINE 30 MG PO TABS: 30.0000 mg | ORAL_TABLET | Freq: Three times a day (TID) | ORAL | 0 refills | Status: DC

## 2022-04-06 MED ORDER — FLUOXETINE HCL 20 MG PO CAPS: 20.0000 mg | ORAL_CAPSULE | Freq: Every day | ORAL | 1 refills | Status: DC

## 2022-04-06 NOTE — Progress Notes (Signed)
Justin [place] [id number] 1950-10-15 71 y.o.   Subjective:   Patient ID:  Justin [place] is a 71 y.o. (DOB 09-28-1950) male.  Chief Complaint:  Chief Complaint  Patient presents with   Follow-up   ADHD   Anxiety    HPI Justin [place] presents for follow-up of their ADD, history of anxiety and history of depression.  At visit Jan 10, 2019.  We initiated a return to Lexapro 20 mg for some depressive and obsessive symptoms.  He was alternating between Vyvanse and Adderall because of cost.  12/12/19 appt with following noted: Pretty good overall.  Good sleep.  L-methylfolate too expensive.  Iron level low.  Taking vitamin D usually. D got on his nerves decluttering him and throwing things away.     Gets distracted cleaning and straightening up.   Semi retired working 3 days a week. Feels better with less depression and less negative and less obsessive negative thinking.  His clinic closed bc of Covid affecting the clinic.  Still struggles getting things done bc overwhelmed with demands.  May be a little anemic and that causes some tiredness.  B12 is normal.   Riding bike.  Tend to be depressed more than he wants to be.  Can be depressed and some obsessiveness and can't let go of things.   Plan: For cost reasons he has stopped Vyvanse and switched to Adderall 30 mg 3 times daily.   Continue Lexapro 10 and lithium 300 mg daily and lamotrigine 150. No med changes today.  06/30/2020 appointment with the following noted: No covid.  Vaccinated. Overall maintaining mood.  Lonely.  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  3rd D is still in school in [place] and causes some stress.  Justin [place] and has GD.  Justin [place] with [facility].   Disc withdrawal with Adderall.  Still takes longer than he wants to do things.   Plan no med changes  12/15/2020 appointment with the following noted: Occ feels winded but cardiologist says he's OK.  Energy is variable from good to not so  good.  Sleep seems pretty good overall.  Admits he still has too much caffeine and often after 6 PM.  Would like to meet a woman. Asks about meds and tiredness. Overall mood and anxiety are OK.  Drags some in the morning wondering what to do with the day.  Good interest and enjoyment.   Plan: Continue lithium 300 mg daily.  Lexapro 10 mg daily and lamotrigine 100 mg daily. And Adderall 30 mg TID No med changes today except trial reduction in lamotrigine to 100 mg daily to see if energy is better.  07/02/2021 appointment with the following noted: Mood has been OK.  Mostly same old issues.  Need to work on negative thought process.  Still looking for savior girl friend to rescue me but realizes it's not the right way to think. Hard to keep the house clean.  59 yo grand daughter said his kitchen was a mess.   Still chronically sleepy and tired. Usually sleep average 7 hours. No unusual mood swings.  Stays busy.  Patient reports stable mood and denies irritable moods.  No manic symptoms.  Patient denies any recent difficulty with anxiety.  Patient denies difficulty with sleep initiation or maintenance. 6-8 hours sleep.   Denies appetite disturbance.  Patient reports that  motivation have been good.  Patient still has problems with concentration.  Patient denies any  suicidal ideation. Plan: no changes  10/05/21 appt noted:   More tired than he used to be.  Pending PE.  Usually chronically active.  Usually exercises.  A little worried.   Chronically scattered and difficulty getting things done.  Recognizes still hoarder.  Wonders if still depreessed but staff says he looks better.  Not hopeless or helpless.  Recognizes procrastination.   Drinks a lot of caffeine and has the Adderall.   Plans to continue cycling.    04/06/22 appt noted: Crashes if out of Adderall and dealing with shortage.  Missed some this week. Can function so much better with it. Worries about some chronic tiredness and some SOB.  Some  worry. Notices some differences with generic Adderall.  White ones less effective than orange ones. Going to [place] August and to visit's mother's home town. [place] may go too. Enjoys new riding Associate Professor.  Has 6 others. Mood has been ok.  No swings. Chronic procrastination.  Ongoing but some progress in areas. Drinks energy sodas at times.   Satisfied with meds.  Past Psychiatric Medication Trials: Modafinil, Strattera tremor, Wellbutrin, Vyvanse, Ritalin,  Adzenys, Adderall 30 TID, Aricept off label without benefit,  lamotrigine,  Depakote tremor,   Lexapro 10  Review of Systems:  Review of Systems  Constitutional:  Positive for fatigue.  Neurological:  Negative for tremors.  Psychiatric/Behavioral:  Positive for decreased concentration.     Medications: I have reviewed the patient's current medications.  Current Outpatient Medications  Medication Sig Dispense Refill   amphetamine-dextroamphetamine (ADDERALL) 30 MG tablet Take 1 tablet by mouth 3 (three) times daily. 90 tablet 0   atorvastatin (LIPITOR) 10 MG tablet TAKE 1 TABLET BY MOUTH DAILY. PLEASE CALL OFFICE FOR FURTHER REFILLS 90 tablet 0   Cholecalciferol (VITAMIN D3) 1.25 MG (50000 UT) CAPS Take 50,000 Units by mouth once a week.     FLUoxetine (PROZAC) 20 MG capsule Take 1 capsule (20 mg total) by mouth daily. 90 capsule 1   lamoTRIgine (LAMICTAL) 100 MG tablet Take 1 tablet (100 mg total) by mouth daily. 90 tablet 3   Multiple Vitamin (MULTIVITAMIN) capsule Take 1 capsule by mouth daily.     naproxen (NAPROSYN) 500 MG tablet Take 1 tablet by mouth as needed.     Omega 3 1200 MG CAPS Take by mouth. 1-2 caps daily     No current facility-administered medications for this visit.    Medication Side Effects: None  Allergies: No Known Allergies  Past Medical History:  Diagnosis Date   ADHD    Anxiety and depression    Hyperlipidemia    Vitamin D insufficiency     Family History  Problem Relation Age of Onset    Heart failure Father    Diabetes Father    Diabetes Sister    Bipolar disorder Sister    Heart disease Brother    Hepatitis C Brother    Depression Brother     Social History   Socioeconomic History   Marital status: Divorced    Spouse name: Not on file   Number of children: 4   Years of education: Not on file   Highest education level: Not on file  Occupational History   Not on file  Tobacco Use   Smoking status: Never   Smokeless tobacco: Never  Vaping Use   Vaping Use: Never used  Substance and Sexual Activity   Alcohol use: Yes    Alcohol/week: 7.0 standard drinks of alcohol  Types: 7 Shots of liquor per week    Comment: Occasional   Drug use: Never   Sexual activity: Not on file  Other Topics Concern   Not on file  Social History Narrative   Not on file   Social Determinants of Health   Financial Resource Strain: Not on file  Food Insecurity: Not on file  Transportation Needs: Not on file  Physical Activity: Not on file  Stress: Not on file  Social Connections: Not on file  Intimate Partner Violence: Not on file    Past Medical History, Surgical history, Social history, and Family history were reviewed and updated as appropriate.   Please see review of systems for further details on the patient's review from today.   Objective:   Physical Exam:  BP 125/78   Pulse 93   Physical Exam Constitutional:      General: He is not in acute distress. Musculoskeletal:        General: No deformity.  Neurological:     Mental Status: He is alert and oriented to person, place, and time.     Cranial Nerves: No dysarthria.     Coordination: Coordination normal.  Psychiatric:        Attention and Perception: Attention and perception normal. He does not perceive auditory or visual hallucinations.        Mood and Affect: Mood is anxious. Mood is not depressed. Affect is not labile or inappropriate.        Speech: Speech normal.        Behavior: Behavior  normal. Behavior is cooperative.        Thought Content: Thought content normal. Thought content is not delusional. Thought content does not include homicidal or suicidal ideation.        Cognition and Memory: Cognition and memory normal.        Judgment: Judgment normal.     Comments: Insight good.   Chronic OCD sx incl cleaning and hoarding but dreads it and procrastinates     Lab Review:     Component Value Date/Time   NA 139 11/15/2018 1235   K 4.2 11/15/2018 1235   CL 104 11/15/2018 1235   CO2 26 11/15/2018 1235   GLUCOSE 117 (H) 11/15/2018 1235   BUN 22 11/15/2018 1235   CREATININE 1.10 11/15/2018 1235   CALCIUM 9.4 11/15/2018 1235   GFRNONAA 69 11/15/2018 1235   GFRAA 80 11/15/2018 1235       Component Value Date/Time   WBC 7.4 11/15/2018 1235   RBC 4.21 11/15/2018 1235   HGB 13.0 (L) 11/15/2018 1235   HCT 39.4 11/15/2018 1235   PLT 287 11/15/2018 1235   MCV 93.6 11/15/2018 1235   MCH 30.9 11/15/2018 1235   MCHC 33.0 11/15/2018 1235   RDW 13.2 11/15/2018 1235   LYMPHSABS 1,029 11/15/2018 1235   EOSABS 104 11/15/2018 1235   BASOSABS 30 11/15/2018 1235    No results found for: "POCLITH", "LITHIUM"   No results found for: "PHENYTOIN", "PHENOBARB", "VALPROATE", "CBMZ"   .res Assessment: Plan:    Netanel was seen today for follow-up, adhd and anxiety.  Diagnoses and all orders for this visit:  Attention deficit hyperactivity disorder (ADHD), combined type  Generalized anxiety disorder  Mild recurrent major depression (HCC)  Mixed obsessional thoughts and acts   Continue fluoxetine 20 mg daily or some depressive symptoms and obsessive compulsive traits which he thinks interferes with his overall productivity.  He has taken this medication in the past  and has noticed benefit again this time..  As noted before we have examined him for bipolar symptoms repeatedly and not seen evidence for that.  However he is aware that if he has latent bipolar tendencies this  could trigger them and if he has any unusual manic or hypomanic symptoms he will let us know.  Discussed side effects of SSRIs.  Disc normal dosage range.  Cont to work on getting rid of clutter.  [name] made some progress.  some chronic OCD tendencies.  But SSRIs not that great for hoarding.   For cost reasons he has stopped Vyvanse and switched to Adderall 30 mg 3 times daily.  He is aware this is a high dose but feels it is medically necessary.  His ADD has been severe and chronic and it is appropriate.  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    He is going to follow-up with his cardiologist as a matter of routine discussed the risk of between dosage sedation if he is not careful to space them out.  He is aware.  Reduce caffeine and none after 3 PM.   Asks about meds and sleepiness.    Discussed potential benefits, risks, and side effects of stimulants with patient to include increased heart rate, palpitations, insomnia, increased anxiety, increased irritability, or decreased appetite.  Instructed patient to contact office if experiencing any significant tolerability issues.  L-methylfolate was too expensive.  He could consider it again using good Rx but that is not convenient to him.  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    continue lamotrigine to 100 mg daily .  He plans to continue vitamin D.  He is continuing counseling with Dr. Laurell Roof. We discussed ADHD strategies.  Follow-up 4-6 months  Iona Hansen, MD, DFAPA   Please see After Visit Summary for patient specific instructions.  No future appointments.   No orders of the defined types were placed in this encounter.      -------------------------------

## 2022-04-06 NOTE — Telephone Encounter (Signed)
PA not required.

## 2022-04-16 IMAGING — CT CT HEART MORP W/ CTA COR W/ SCORE W/ CA W/CM &/OR W/O CM
4 of 7 series · 8 of 20 positions shown, 9 images · IV contrast (APPLIED)
Comparison: None.
COMPARISON: None.

Addendum:
EXAM:
OVER-READ INTERPRETATION  CT CHEST

The following report is an over-read performed by radiologist Dr.
Internaxx Tacchi [REDACTED] on 11/09/2021. This
over-read does not include interpretation of cardiac or coronary
anatomy or pathology. The coronary CTA interpretation by the
cardiologist is attached.
HISTORY: Chest pain/anginal equiv, ECGs or troponins abnormal
Cardiac/Coronary  CT
TECHNIQUE: The patient was scanned on a Siemens Force scanner.
PROTOCOL: A 120 kV prospective scan was triggered in the descending thoracic
aorta at 111 HU's. Axial non-contrast 3 mm slices were carried out
through the heart. The data set was analyzed on a dedicated work
station and scored using the Agatson method. Gantry rotation speed
was 250 msecs and collimation was .6 mm. No IV beta blockade but
mg of sl NTG was given. The 3D data set was reconstructed in 10%
intervals of the 0-90% of the R-R cycle. Diastolic phases were
analyzed on a dedicated work station using MPR, MIP and VRT modes.
The patient received 100mL OMNIPAQUE IOHEXOL 350 MG/ML SOLN of
contrast.

[Series 6: best diast · axial · 0.39mm/px · z∈[+1225,+1271]mm · 2 of 343 slices shown, 3 images]
[im 115/343  vessel]
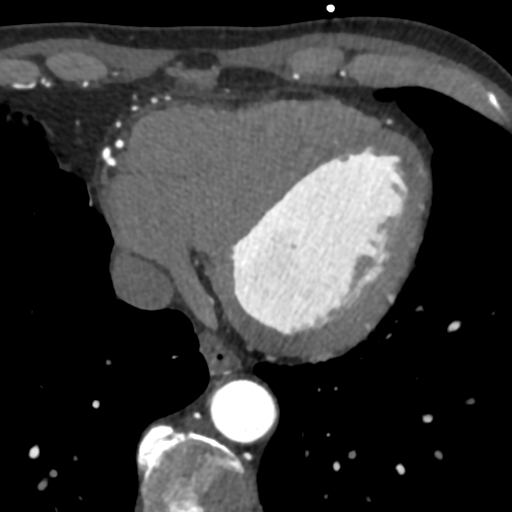
[im 115/343  lung]
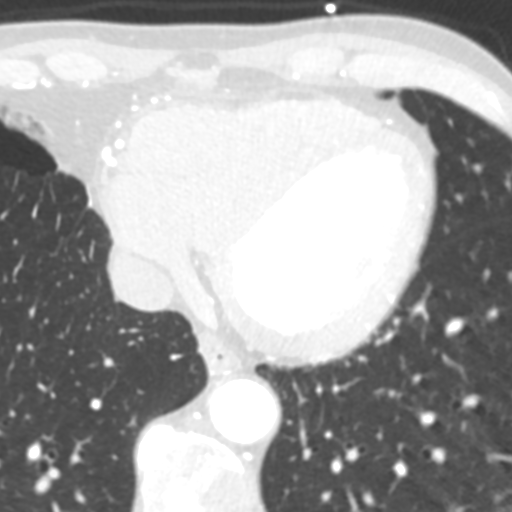
[im 229/343  vessel]
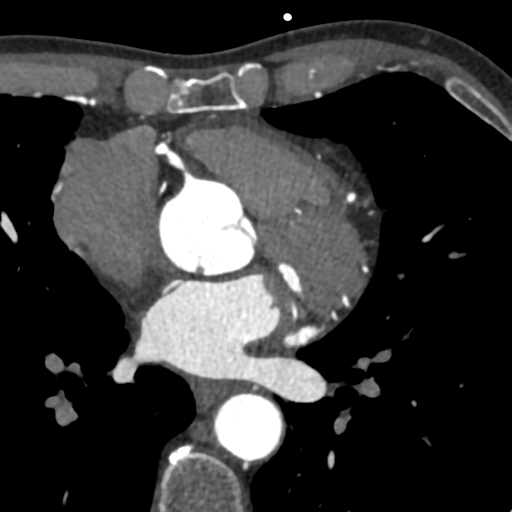

[Series 7: best syst · axial · 0.39mm/px · z∈[+1225,+1271]mm · 2 of 343 slices shown]
[im 115/343  vessel]
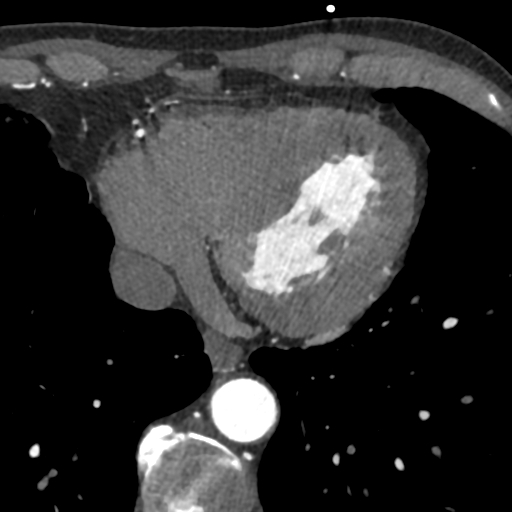
[im 229/343  vessel]
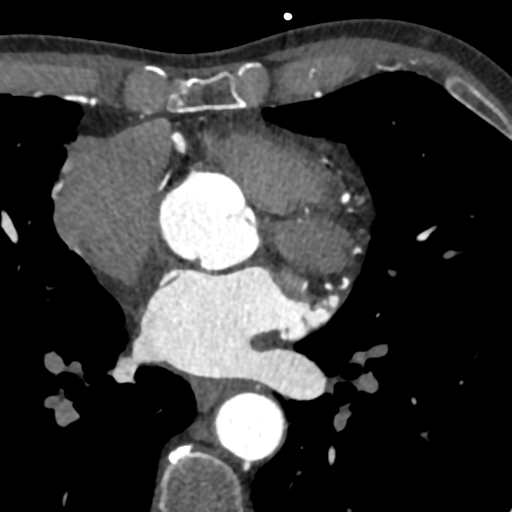

[Series 8: ts diast · axial · 0.39mm/px · z∈[+1225,+1271]mm · 2 of 343 slices shown]
[im 115/343  vessel]
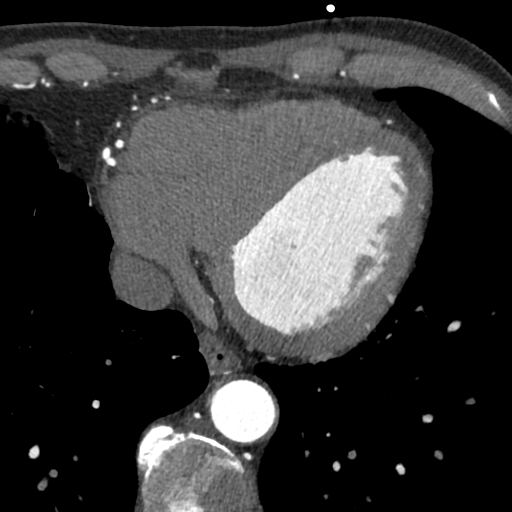
[im 229/343  vessel]
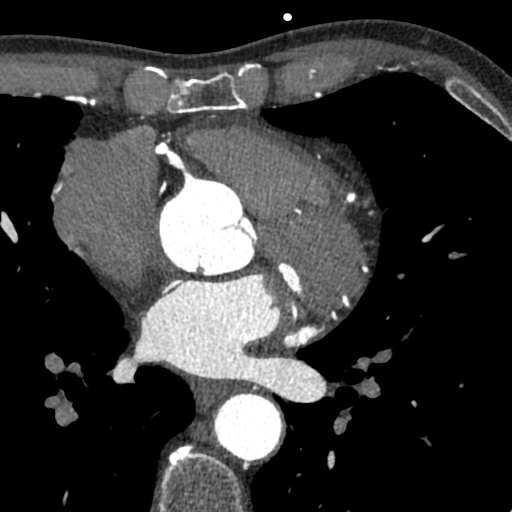

[Series 9: ts syst · axial · 0.39mm/px · z∈[+1225,+1271]mm · 2 of 343 slices shown]
[im 115/343  vessel]
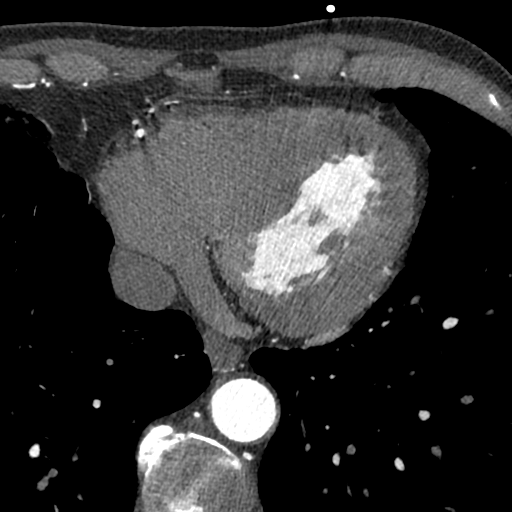
[im 229/343  vessel]
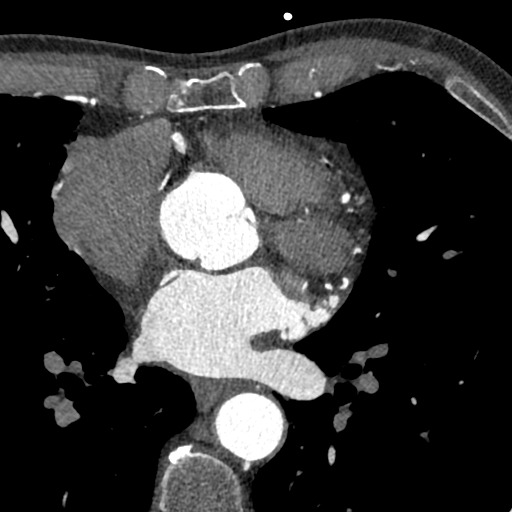

[8 of 20 positions shown; findings below may reference images not displayed]

FINDINGS: Vascular: Atherosclerosis of the descending thoracic aorta.

Mediastinum/Nodes: Visualized mediastinum and hilar regions
demonstrate no lymphadenopathy or masses.

Lungs/Pleura: Sized lungs show no evidence of pulmonary edema,
consolidation, pneumothorax, nodule or pleural fluid.

Upper Abdomen: Well-circumscribed cystic structure in the peripheral
right lobe of the liver measures approximately 2.1 cm and has the
appearance of a simple cyst.

Musculoskeletal: Partially visualized cerclage wires the anterior
right chest wall related to prior chest wall surgery.
IMPRESSION: 1. Atherosclerosis of the descending thoracic aorta.
2. Cystic structure of the upper liver has a benign appearance.
FINDINGS: Image quality: Average.

Artifact: Limited.

Coronary artery calcification score: Total coronary calcium score of
0.

Coronary arteries: Normal coronary origins.  Right dominance.

Left Main Coronary Artery: The left main is a normal caliber vessel
with a normal take off from the left coronary cusp that bifurcates
to form a left anterior descending artery and a left circumflex
artery. There is no plaque or stenosis.

Left Anterior Descending Coronary Artery: Normal caliber vessel,
reaches the apex, gives off 2 patent diagonal branches. The LAD is
patent without evidence of plaque or stenosis.

Left Circumflex Artery: Normal caliber vessel, travels within the
atrioventricular groove, gives off 2 patent obtuse marginal
branches. The LCX and obtuse marginal branches are patent with no
evidence of plaque or stenosis.

Right Coronary Artery: The RCA is dominant with normal take off from
the right coronary cusp. The RCA terminates as a PDA and right
posterolateral branch without evidence of plaque or stenosis.

Left Atrium: Grossly normal in size with no left atrial appendage
filling defect.

Left Ventricle: Grossly normal in size. There are no stigmata of
prior infarction. There is no abnormal filling defect.

Pulmonary arteries: Normal in size without proximal filling defect.

Pulmonary veins: Normal pulmonary venous drainage.

Aorta: Normal size, 35.2 mm at the mid ascending aorta (level of the
PA bifurcation) measured double oblique. Aortic atherosclerosis. No
dissection.

Pericardium: Normal thickness with no significant effusion or
calcium present.

Cardiac valves: The aortic valve is trileaflet with minimal leaflet
calcification. The mitral valve ring is well seated without evidence
of dehiscence.

Extra-cardiac findings: See attached radiology report for
non-cardiac structures.
IMPRESSION: 1. Total coronary calcium score of 0.

2. Normal coronary origin with right dominance.

3. CAD-RADS = 0.  No evidence of CAD.

4. Aortic atherosclerosis.

5. The mitral valve ring is well seated without evidence of
dehiscence.

RECOMMENDATIONS:

Consider non-atherosclerotic causes of dyspnea.

*** End of Addendum ***
EXAM:
OVER-READ INTERPRETATION  CT CHEST

The following report is an over-read performed by radiologist Dr.
Internaxx Tacchi [REDACTED] on 11/09/2021. This
over-read does not include interpretation of cardiac or coronary
anatomy or pathology. The coronary CTA interpretation by the
cardiologist is attached.
FINDINGS: Vascular: Atherosclerosis of the descending thoracic aorta.

Mediastinum/Nodes: Visualized mediastinum and hilar regions
demonstrate no lymphadenopathy or masses.

Lungs/Pleura: Sized lungs show no evidence of pulmonary edema,
consolidation, pneumothorax, nodule or pleural fluid.

Upper Abdomen: Well-circumscribed cystic structure in the peripheral
right lobe of the liver measures approximately 2.1 cm and has the
appearance of a simple cyst.

Musculoskeletal: Partially visualized cerclage wires the anterior
right chest wall related to prior chest wall surgery.
IMPRESSION: 1. Atherosclerosis of the descending thoracic aorta.
2. Cystic structure of the upper liver has a benign appearance.

## 2022-07-02 ENCOUNTER — Telehealth: Payer: Self-pay | Admitting: Psychiatry

## 2022-07-02 ENCOUNTER — Other Ambulatory Visit: Payer: Self-pay

## 2022-07-02 DIAGNOSIS — F902 Attention-deficit hyperactivity disorder, combined type: Secondary | ICD-10-CM

## 2022-07-02 MED ORDER — AMPHETAMINE-DEXTROAMPHETAMINE 30 MG PO TABS: 30.0000 mg | ORAL_TABLET | Freq: Three times a day (TID) | ORAL | 0 refills | Status: DC

## 2022-07-02 NOTE — Telephone Encounter (Signed)
Pended.

## 2022-07-02 NOTE — Telephone Encounter (Signed)
Pt Lvm asking for refill of Adderall 30mg  to   [id number] [place], [place] [address], [place] [address] Phone: [phone]  Fax: [phone]   Next appt 1/25

## 2022-09-06 ENCOUNTER — Ambulatory Visit: Payer: Medicare HMO | Admitting: Psychiatry

## 2022-09-23 ENCOUNTER — Ambulatory Visit (INDEPENDENT_AMBULATORY_CARE_PROVIDER_SITE_OTHER): Payer: Medicare HMO | Admitting: Psychiatry

## 2022-09-23 ENCOUNTER — Encounter: Payer: Self-pay | Admitting: Psychiatry

## 2022-09-23 VITALS — BP 151/81 | HR 70

## 2022-09-23 DIAGNOSIS — F422 Mixed obsessional thoughts and acts: Secondary | ICD-10-CM

## 2022-09-23 DIAGNOSIS — F411 Generalized anxiety disorder: Secondary | ICD-10-CM | POA: Diagnosis not present

## 2022-09-23 DIAGNOSIS — R69 Illness, unspecified: Secondary | ICD-10-CM | POA: Diagnosis not present

## 2022-09-23 DIAGNOSIS — F423 Hoarding disorder: Secondary | ICD-10-CM | POA: Diagnosis not present

## 2022-09-23 DIAGNOSIS — F902 Attention-deficit hyperactivity disorder, combined type: Secondary | ICD-10-CM

## 2022-09-23 DIAGNOSIS — F330 Major depressive disorder, recurrent, mild: Secondary | ICD-10-CM

## 2022-09-23 MED ORDER — AMPHETAMINE-DEXTROAMPHETAMINE 30 MG PO TABS: 30.0000 mg | ORAL_TABLET | Freq: Three times a day (TID) | ORAL | 0 refills | Status: DC

## 2022-09-23 MED ORDER — FLUOXETINE HCL 20 MG PO CAPS: 20.0000 mg | ORAL_CAPSULE | Freq: Every day | ORAL | 1 refills | Status: DC

## 2022-09-23 MED ORDER — LAMOTRIGINE 100 MG PO TABS: 100.0000 mg | ORAL_TABLET | Freq: Every day | ORAL | 3 refills | Status: DC

## 2022-09-23 NOTE — Progress Notes (Signed)
[name] [id number] 1951-07-11 72 y.o.   Subjective:   Patient ID:  Justin Marshall is a 72 y.o. (DOB [birth year]) male.  Chief Complaint:  Chief Complaint  Patient presents with   Follow-up   ADHD   Anxiety   Depression    HPI [name] presents for follow-up of their ADD, history of anxiety and history of depression.  At visit Jan 10, 2019.  We initiated a return to Lexapro 20 mg for some depressive and obsessive symptoms.  He was alternating between Vyvanse and Adderall because of cost.  12/12/19 appt with following noted: Pretty good overall.  Good sleep.  L-methylfolate too expensive.  Iron level low.  Taking vitamin D usually. D got on his nerves decluttering him and throwing things away.     Gets distracted cleaning and straightening up.   Semi retired working 3 days a week. Feels better with less depression and less negative and less obsessive negative thinking.  His clinic closed bc of Covid affecting the clinic.  Still struggles getting things done bc overwhelmed with demands.  May be a little anemic and that causes some tiredness.  B12 is normal.   Riding bike.  Tend to be depressed more than he wants to be.  Can be depressed and some obsessiveness and can't let go of things.   Plan: For cost reasons he has stopped Vyvanse and switched to Adderall 30 mg 3 times daily.   Continue Lexapro 10 and lithium 300 mg daily and lamotrigine 150. No med changes today.  06/30/2020 appointment with the following noted: No covid.  Vaccinated. Overall maintaining mood.  Lonely.  Youngest D PHD psychologist and just got married.  3rd D is still in school in PHD and causes some stress.  2nd D in [place] and has GD.  Oldest D NP with [facility].   Disc withdrawal with Adderall.  Still takes longer than he wants to do things.   Plan no med changes  12/15/2020 appointment with the following noted: Occ feels winded but cardiologist says he's OK.  Energy is variable from good  to not so good.  Sleep seems pretty good overall.  Admits he still has too much caffeine and often after 6 PM.  Would like to meet a woman. Asks about meds and tiredness. Overall mood and anxiety are OK.  Drags some in the morning wondering what to do with the day.  Good interest and enjoyment.   Plan: Continue lithium 300 mg daily.  Lexapro 10 mg daily and lamotrigine 100 mg daily. And Adderall 30 mg TID No med changes today except trial reduction in lamotrigine to 100 mg daily to see if energy is better.  07/02/2021 appointment with the following noted: Mood has been OK.  Mostly same old issues.  Need to work on negative thought process.  Still looking for savior girl friend to rescue me but realizes it's not the right way to think. Hard to keep the house clean.  34 yo grand daughter said his kitchen was a mess.   Still chronically sleepy and tired. Usually sleep average 7 hours. No unusual mood swings.  Stays busy.  Patient reports stable mood and denies irritable moods.  No manic symptoms.  Patient denies any recent difficulty with anxiety.  Patient denies difficulty with sleep initiation or maintenance. 6-8 hours sleep.   Denies appetite disturbance.  Patient reports that  motivation have been good.  Patient still has problems with concentration.  Patient denies any suicidal ideation. Plan: no changes  10/05/21 appt noted:   More tired than he used to be.  Pending PE.  Usually chronically active.  Usually exercises.  A little worried.   Chronically scattered and difficulty getting things done.  Recognizes still hoarder.  Wonders if still depreessed but staff says he looks better.  Not hopeless or helpless.  Recognizes procrastination.   Drinks a lot of caffeine and has the Adderall.   Plans to continue cycling.    04/06/22 appt noted: Crashes if out of Adderall and dealing with shortage.  Missed some this week. Can function so much better with it. Worries about some chronic tiredness and some  SOB.  Some worry. Notices some differences with generic Adderall.  White ones less effective than orange ones. Going to [place] August and to visit's mother's home town. B may go too. Enjoys new riding Marketing executive.  Has 6 others. Mood has been ok.  No swings. Chronic procrastination.  Ongoing but some progress in areas. Drinks energy sodas at times.   Satisfied with meds.   09/23/2022 appointment noted: Psych meds: Adderall 30 mg 3 times daily for severe ADD, fluoxetine 20 mg daily, lamotrigine 100 mg daily No worse than usual.  Nothing spectacular since here.  Still working and looking for a soul mate.  Not very active socially.   No sig heart issues and health.  Good endurance. Mood good.  No unusual anxiety.  Sleep ok.   No SE.   Gets sleepy when 3rd Adderall wears off.  Past Psychiatric Medication Trials: Modafinil, Strattera tremor, Wellbutrin, Vyvanse, Ritalin,  Adzenys, Adderall 30 TID, Aricept off label without benefit,  lamotrigine,  Depakote tremor,   Lexapro 10  Review of Systems:  Review of Systems  Constitutional:  Positive for fatigue.  Cardiovascular:  Negative for palpitations.  Neurological:  Negative for tremors.  Psychiatric/Behavioral:  Positive for decreased concentration.     Medications: I have reviewed the patient's current medications.  Current Outpatient Medications  Medication Sig Dispense Refill   atorvastatin (LIPITOR) 10 MG tablet TAKE 1 TABLET BY MOUTH DAILY. PLEASE CALL OFFICE FOR FURTHER REFILLS 90 tablet 0   Cholecalciferol (VITAMIN D3) 1.25 MG (50000 UT) CAPS Take 50,000 Units by mouth once a week.     FLUoxetine (PROZAC) 20 MG capsule Take 1 capsule (20 mg total) by mouth daily. 90 capsule 1   lamoTRIgine (LAMICTAL) 100 MG tablet Take 1 tablet (100 mg total) by mouth daily. 90 tablet 3   Multiple Vitamin (MULTIVITAMIN) capsule Take 1 capsule by mouth daily.     naproxen (NAPROSYN) 500 MG tablet Take 1 tablet by mouth as needed.     Omega  3 1200 MG CAPS Take by mouth. 1-2 caps daily     amphetamine-dextroamphetamine (ADDERALL) 30 MG tablet Take 1 tablet by mouth 3 (three) times daily. 90 tablet 0   [START ON 10/21/2022] amphetamine-dextroamphetamine (ADDERALL) 30 MG tablet Take 1 tablet by mouth 3 (three) times daily. 90 tablet 0   [START ON 11/18/2022] amphetamine-dextroamphetamine (ADDERALL) 30 MG tablet Take 1 tablet by mouth 3 (three) times daily. 90 tablet 0   No current facility-administered medications for this visit.    Medication Side Effects: None  Allergies: No Known Allergies  Past Medical History:  Diagnosis Date   ADHD    Anxiety and depression    Hyperlipidemia    Vitamin D insufficiency     Family History  Problem Relation Age of Onset  Heart failure Father    Diabetes Father    Diabetes Sister    Bipolar disorder Sister    Heart disease Brother    Hepatitis C Brother    Depression Brother     Social History   Socioeconomic History   Marital status: Divorced    Spouse name: Not on file   Number of children: 4   Years of education: Not on file   Highest education level: Not on file  Occupational History   Not on file  Tobacco Use   Smoking status: Never   Smokeless tobacco: Never  Vaping Use   Vaping Use: Never used  Substance and Sexual Activity   Alcohol use: Yes    Alcohol/week: 7.0 standard drinks of alcohol    Types: 7 Shots of liquor per week    Comment: Occasional   Drug use: Never   Sexual activity: Not on file  Other Topics Concern   Not on file  Social History Narrative   Not on file   Social Determinants of Health   Financial Resource Strain: Not on file  Food Insecurity: Not on file  Transportation Needs: Not on file  Physical Activity: Not on file  Stress: Not on file  Social Connections: Not on file  Intimate Partner Violence: Not on file    Past Medical History, Surgical history, Social history, and Family history were reviewed and updated as  appropriate.   Please see review of systems for further details on the patient's review from today.   Objective:   Physical Exam:  BP (!) 151/81   Pulse 70   Physical Exam Constitutional:      General: He is not in acute distress. Musculoskeletal:        General: No deformity.  Neurological:     Mental Status: He is alert and oriented to person, place, and time.     Cranial Nerves: No dysarthria.     Coordination: Coordination normal.  Psychiatric:        Attention and Perception: Attention and perception normal. He does not perceive auditory or visual hallucinations.        Mood and Affect: Mood is anxious. Mood is not depressed. Affect is not labile or inappropriate.        Speech: Speech normal.        Behavior: Behavior normal. Behavior is cooperative.        Thought Content: Thought content normal. Thought content is not delusional. Thought content does not include homicidal or suicidal ideation.        Cognition and Memory: Cognition and memory normal.        Judgment: Judgment normal.     Comments: Insight good.   Chronic OCD sx incl cleaning and hoarding but dreads it and procrastinates.  Not worse.     Lab Review:     Component Value Date/Time   NA 139 11/15/2018 1235   K 4.2 11/15/2018 1235   CL 104 11/15/2018 1235   CO2 26 11/15/2018 1235   GLUCOSE 117 (H) 11/15/2018 1235   BUN 22 11/15/2018 1235   CREATININE 1.10 11/15/2018 1235   CALCIUM 9.4 11/15/2018 1235   GFRNONAA 69 11/15/2018 1235   GFRAA 80 11/15/2018 1235       Component Value Date/Time   WBC 7.4 11/15/2018 1235   RBC 4.21 11/15/2018 1235   HGB 13.0 (L) 11/15/2018 1235   HCT 39.4 11/15/2018 1235   PLT 287 11/15/2018 1235   MCV 93.6 11/15/2018  1235   MCH 30.9 11/15/2018 1235   MCHC 33.0 11/15/2018 1235   RDW 13.2 11/15/2018 1235   LYMPHSABS 1,029 11/15/2018 1235   EOSABS 104 11/15/2018 1235   BASOSABS 30 11/15/2018 1235    No results found for: "POCLITH", "LITHIUM"   No results  found for: "PHENYTOIN", "PHENOBARB", "VALPROATE", "CBMZ"   .res Assessment: Plan:    Billey was seen today for follow-up, adhd, anxiety and depression.  Diagnoses and all orders for this visit:  Mild recurrent major depression (HCC)  Attention deficit hyperactivity disorder (ADHD), combined type -     amphetamine-dextroamphetamine (ADDERALL) 30 MG tablet; Take 1 tablet by mouth 3 (three) times daily. -     amphetamine-dextroamphetamine (ADDERALL) 30 MG tablet; Take 1 tablet by mouth 3 (three) times daily. -     amphetamine-dextroamphetamine (ADDERALL) 30 MG tablet; Take 1 tablet by mouth 3 (three) times daily.  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  He has taken this medication in the past and has noticed benefit again this time..  Discussed side effects of SSRIs.  Disc normal dosage range.  No evidence for mania in recent years.  Cont to work on getting rid of clutter.  [name] made some progress.  some chronic OCD tendencies.  But SSRIs not that great for hoarding.    For cost reasons he has stopped Vyvanse and switched to Adderall 30 mg 3 times daily.  He is aware this is a high dose but feels it is medically necessary.  His ADD has been severe and chronic and it is appropriate.  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    He is going to follow-up with his cardiologist as a matter of routine discussed the risk of between dosage sedation if he is not careful to space them out.  He is aware.  Reduce caffeine and none after 3 PM.   Asks about meds and sleepiness.    Discussed potential benefits, risks, and side effects of stimulants with patient to include increased heart rate,  palpitations, insomnia, increased anxiety, increased irritability, or decreased appetite.  Instructed patient to contact office if experiencing any significant tolerability issues.  Disc withdrawal if consistent.    L-methylfolate was too expensive.  He could consider it again using good Rx but that is not convenient to him.  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    No med changes:  Continue Adderall 30 mg 3 times daily for severe ADD, fluoxetine 20 mg daily, lamotrigine 100 mg daily  He plans to continue vitamin D.  He is continuing counseling with Dr. Laurell Roof. We discussed ADHD strategies.  Follow-up 6 months  Iona Hansen, MD, DFAPA   Please see After Visit Summary for patient specific instructions.  No future appointments.   No orders of the defined types were placed in this encounter.      -------------------------------

## 2022-10-14 ENCOUNTER — Other Ambulatory Visit: Payer: Self-pay

## 2022-10-14 ENCOUNTER — Telehealth: Payer: Self-pay | Admitting: Psychiatry

## 2022-10-14 DIAGNOSIS — F902 Attention-deficit hyperactivity disorder, combined type: Secondary | ICD-10-CM

## 2022-10-14 MED ORDER — AMPHETAMINE-DEXTROAMPHETAMINE 30 MG PO TABS: 30.0000 mg | ORAL_TABLET | Freq: Three times a day (TID) | ORAL | 0 refills | Status: DC

## 2022-10-14 NOTE — Telephone Encounter (Signed)
Pt called and said that he [facility] [place] [facility] in [facility]

## 2022-10-14 NOTE — Telephone Encounter (Signed)
Pended.

## 2022-11-08 ENCOUNTER — Ambulatory Visit (INDEPENDENT_AMBULATORY_CARE_PROVIDER_SITE_OTHER): Payer: Medicare HMO | Admitting: Psychiatry

## 2022-11-08 DIAGNOSIS — F902 Attention-deficit hyperactivity disorder, combined type: Secondary | ICD-10-CM

## 2022-11-08 DIAGNOSIS — F330 Major depressive disorder, recurrent, mild: Secondary | ICD-10-CM

## 2022-11-08 DIAGNOSIS — Z789 Other specified health status: Secondary | ICD-10-CM

## 2022-11-08 DIAGNOSIS — F411 Generalized anxiety disorder: Secondary | ICD-10-CM | POA: Diagnosis not present

## 2022-11-08 DIAGNOSIS — F423 Hoarding disorder: Secondary | ICD-10-CM | POA: Diagnosis not present

## 2022-11-08 NOTE — Progress Notes (Signed)
Psychotherapy Progress Note [facility] Marliss Czar, PhD LP  Patient ID: Justin Marshall)    MRN: [id number] Therapy format: Individual psychotherapy Date: 11/08/2022      Start: 2:10p     Stop: 2:58p     Time Spent: 48 min Location: In-person   Session narrative (presenting needs, interim history, self-report of stressors and symptoms, applications of prior therapy, status changes, and interventions made in session) Over 8 mos since last seen.  Struggling to delete emails, same as material clutter.  Cousin died early 2022/09/26.  Has been in touch with a woman he met "accidentally" online, turns out to be from [place], named [place], and he has shared a fairly considerable amount of personal info.  Has occurred to him she's probably scamming somehow, "but I'm lonely".  Went through a dalliance with a younger woman nearby, admittedly bipolar, with 6 kids and financial problems.  Knows he's been feeling somewhat desperate to find love again, now that he's in his 1s, and to father a boy, and to get help with housekeeping.  Still way backlogged for cleaning up and gleaning tax information for unfiled and amended returns from the mid 2010s.  Still typical to drink coffee into the evening.  Relations with his daughters are good, stable, no complaints, gets to see grands in [place] often enough and cook together.  Has considered a partial sale of property, but held back b/c not sure of the prospective buyer as a Network engineer.  While the property will become too much for him to manage eventually, it is wise not to lap into a permanent neighbor relationship, either.  Encouraged be very careful about online scammer, let his inner alarm guide him with unstable women lest he repeat the ruin he's known before (albeit highly likely nothing new will involve the Medical Board again).  Best get on with cleaning out, for his own peace of mind and for benefit of the children who will survive him, and it  really does come down to targeting and focusing for defined progress, e.g., a part of a room, or tax records in particular, for a defined length of time.  And maybe pulling things out of their usual places, into a more conducive area to sort, to begin momentum to throwing stuff out.  Still advise caffeine curfew, despite perceived non-effect of caffeine (able to go to sleep).  Therapeutic modalities: Cognitive Behavioral Therapy, Solution-Oriented/Positive Psychology, Environmental manager, and Motivational Interviewing  Mental Status/Observations:  Appearance:   Casual     Behavior:  Appropriate  Motor:  Restlestness  Speech/Language:   Clear and Coherent  Affect:  Appropriate  Mood:  anxious and dysthymic  Thought process:  tangential  Thought content:    WNL  Sensory/Perceptual disturbances:    WNL  Orientation:  Fully oriented  Attention:  Good    Concentration:  Fair  Memory:  WNL  Insight:    Good  Judgment:   Fair  Impulse Control:  Fair   Risk Assessment: Danger to Self: No Self-injurious Behavior: No Danger to Others: No Physical Aggression / Violence: No Duty to Warn: No Access to Firearms a concern: No  Assessment of progress:  stabilized  Diagnosis:   ICD-10-CM   1. Attention deficit hyperactivity disorder (ADHD), combined type  F90.2     2. Mild recurrent major depression (HCC)  F33.0     3. 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  Generalized anxiety disorder  F41.1     5. Excessive caffeine intake -- r/o dependence  Z78.9      Plan:  Boundaries -- great caution with women met online and locally who seem in any way to be too good to be true; track record is, they are, and he is drawn to them Stimulant management -- less caffeine (incl energy drinks), curfew at least 6 hrs ahead of bed, make sure to manage Adderall dosing to ensure it does not go above recommendations and wears off for bedtime Sleep hygiene -- set  night displays on available electronics for 2-3 hours before intended bedtime, authoritatively decide to let go of the day's unfinished business at a reasonable hour Alternative energizing tactics -- wakeful breathing techniques, try MCT oil for hitting the wall by daytime to test metabolic theory Productivity in tasks -- Pomodoro technique, targeted area or type of cleaning out.  For things like tax amendments and restoring old machines, urge winnowing down "projects", accept that some things will never get done, and prioritize handing off a smaller mess to family at the end of his life. Morale/self-esteem -- Be quick as possible to acknowledge shame/blame in progress and affirm just doing the next decent thing; satisfaction will be in doing, not thinking Other recommendations/advice as may be noted above Continue to utilize previously learned skills ad lib Maintain medication as prescribed and work faithfully with relevant prescriber(s) if any changes are desired or seem indicated Call the clinic on-call service, 988/hotline, 911, or present to [facility] or ER if any life-threatening psychiatric crisis Return for time at discretion. Already scheduled visit in this office 03/24/2023.  Robley Fries, PhD Marliss Czar, PhD LP Clinical Psychologist, [facility] [address], [address] [place], [place] [address] [phone]

## 2022-11-29 DIAGNOSIS — R400 Somnolence: Secondary | ICD-10-CM | POA: Diagnosis not present

## 2022-11-29 DIAGNOSIS — R5383 Other fatigue: Secondary | ICD-10-CM | POA: Diagnosis not present

## 2022-11-29 DIAGNOSIS — Z6824 Body mass index (BMI) 24.0-24.9, adult: Secondary | ICD-10-CM | POA: Diagnosis not present

## 2022-12-30 DIAGNOSIS — G4719 Other hypersomnia: Secondary | ICD-10-CM | POA: Diagnosis not present

## 2023-01-11 ENCOUNTER — Other Ambulatory Visit: Payer: Self-pay | Admitting: Psychiatry

## 2023-01-11 ENCOUNTER — Telehealth: Payer: Self-pay | Admitting: Psychiatry

## 2023-01-11 DIAGNOSIS — F902 Attention-deficit hyperactivity disorder, combined type: Secondary | ICD-10-CM

## 2023-01-11 MED ORDER — AMPHETAMINE-DEXTROAMPHETAMINE 30 MG PO TABS: 30.0000 mg | ORAL_TABLET | Freq: Three times a day (TID) | ORAL | 0 refills | Status: DC

## 2023-01-11 NOTE — Telephone Encounter (Signed)
Pt LVM @ 9:42a.  He would like refill of Adderall 30mg  to   [id number] [place], [place] - [address] AT [facility] [address], [place] [address] Phone: [phone]  Fax: [phone]   It is in stock there. Next appt 7/25

## 2023-01-20 DIAGNOSIS — G4733 Obstructive sleep apnea (adult) (pediatric): Secondary | ICD-10-CM | POA: Diagnosis not present

## 2023-02-08 DIAGNOSIS — H353131 Nonexudative age-related macular degeneration, bilateral, early dry stage: Secondary | ICD-10-CM | POA: Diagnosis not present

## 2023-02-08 DIAGNOSIS — H43813 Vitreous degeneration, bilateral: Secondary | ICD-10-CM | POA: Diagnosis not present

## 2023-02-08 DIAGNOSIS — H04123 Dry eye syndrome of bilateral lacrimal glands: Secondary | ICD-10-CM | POA: Diagnosis not present

## 2023-02-08 DIAGNOSIS — H40053 Ocular hypertension, bilateral: Secondary | ICD-10-CM | POA: Diagnosis not present

## 2023-02-08 DIAGNOSIS — Z961 Presence of intraocular lens: Secondary | ICD-10-CM | POA: Diagnosis not present

## 2023-02-14 DIAGNOSIS — G4733 Obstructive sleep apnea (adult) (pediatric): Secondary | ICD-10-CM | POA: Diagnosis not present

## 2023-02-14 DIAGNOSIS — F5101 Primary insomnia: Secondary | ICD-10-CM | POA: Diagnosis not present

## 2023-03-10 DIAGNOSIS — H40053 Ocular hypertension, bilateral: Secondary | ICD-10-CM | POA: Diagnosis not present

## 2023-03-16 DIAGNOSIS — F5101 Primary insomnia: Secondary | ICD-10-CM | POA: Diagnosis not present

## 2023-03-16 DIAGNOSIS — G4733 Obstructive sleep apnea (adult) (pediatric): Secondary | ICD-10-CM | POA: Diagnosis not present

## 2023-03-24 ENCOUNTER — Ambulatory Visit: Payer: Medicare HMO | Admitting: Psychiatry

## 2023-04-11 ENCOUNTER — Other Ambulatory Visit: Payer: Self-pay

## 2023-04-11 ENCOUNTER — Telehealth: Payer: Self-pay | Admitting: Psychiatry

## 2023-04-11 DIAGNOSIS — F902 Attention-deficit hyperactivity disorder, combined type: Secondary | ICD-10-CM

## 2023-04-11 MED ORDER — AMPHETAMINE-DEXTROAMPHETAMINE 30 MG PO TABS: 30.0000 mg | ORAL_TABLET | Freq: Three times a day (TID) | ORAL | Status: DC

## 2023-04-11 MED ORDER — AMPHETAMINE-DEXTROAMPHETAMINE 30 MG PO TABS: 30.0000 mg | ORAL_TABLET | Freq: Three times a day (TID) | ORAL | 0 refills | Status: DC

## 2023-04-11 NOTE — Telephone Encounter (Signed)
Pt lvm that he needs a refill on his adderall 30 mg. [facility] [place]

## 2023-04-11 NOTE — Telephone Encounter (Signed)
Pended.

## 2023-04-16 DIAGNOSIS — G4733 Obstructive sleep apnea (adult) (pediatric): Secondary | ICD-10-CM | POA: Diagnosis not present

## 2023-04-16 DIAGNOSIS — F5101 Primary insomnia: Secondary | ICD-10-CM | POA: Diagnosis not present

## 2023-05-12 ENCOUNTER — Ambulatory Visit (INDEPENDENT_AMBULATORY_CARE_PROVIDER_SITE_OTHER): Payer: Medicare HMO | Admitting: Psychiatry

## 2023-05-12 ENCOUNTER — Encounter: Payer: Self-pay | Admitting: Psychiatry

## 2023-05-12 DIAGNOSIS — F330 Major depressive disorder, recurrent, mild: Secondary | ICD-10-CM

## 2023-05-12 DIAGNOSIS — F422 Mixed obsessional thoughts and acts: Secondary | ICD-10-CM

## 2023-05-12 DIAGNOSIS — F411 Generalized anxiety disorder: Secondary | ICD-10-CM

## 2023-05-12 DIAGNOSIS — F902 Attention-deficit hyperactivity disorder, combined type: Secondary | ICD-10-CM

## 2023-05-12 DIAGNOSIS — F423 Hoarding disorder: Secondary | ICD-10-CM

## 2023-05-12 MED ORDER — AMPHETAMINE-DEXTROAMPHETAMINE 30 MG PO TABS: 30.0000 mg | ORAL_TABLET | Freq: Three times a day (TID) | ORAL | 0 refills | Status: DC

## 2023-05-12 MED ORDER — FLUOXETINE HCL 20 MG PO CAPS
20.0000 mg | ORAL_CAPSULE | Freq: Every day | ORAL | 1 refills | Status: DC
Start: 2023-05-12 — End: 2023-11-03

## 2023-05-12 MED ORDER — AMPHETAMINE-DEXTROAMPHETAMINE 30 MG PO TABS
30.0000 mg | ORAL_TABLET | Freq: Three times a day (TID) | ORAL | 0 refills | Status: DC
Start: 2023-07-07 — End: 2023-07-22

## 2023-05-12 NOTE — Progress Notes (Signed)
Justin Marshall [id number] Feb 16, 1951 72 y.o.   Subjective:   Patient ID:  Justin Marshall is a 72 y.o. (DOB 02-18-1951) male.  Chief Complaint:  Chief Complaint  Patient presents with   Follow-up    HPI Justin Marshall presents for follow-up of their ADD, history of anxiety and history of depression.  At visit Jan 10, 2019.  We initiated a return to Lexapro 20 mg for some depressive and obsessive symptoms.  He was alternating between Vyvanse and Adderall because of cost.  12/12/19 appt with following noted: Pretty good overall.  Good sleep.  L-methylfolate too expensive.  Iron level low.  Taking vitamin D usually. D got on his nerves decluttering him and throwing things away.     Gets distracted cleaning and straightening up.   Semi retired working 3 days a week. Feels better with less depression and less negative and less obsessive negative thinking.  His clinic closed bc of Covid affecting the clinic.  Still struggles getting things done bc overwhelmed with demands.  May be a little anemic and that causes some tiredness.  B12 is normal.   Riding bike.  Tend to be depressed more than he wants to be.  Can be depressed and some obsessiveness and can't let go of things.   Plan: For cost reasons he has stopped Vyvanse and switched to Adderall 30 mg 3 times daily.   Continue Lexapro 10 and lithium 300 mg daily and lamotrigine 150. No med changes today.  06/30/2020 appointment with the following noted: No covid.  Vaccinated. Overall maintaining mood.  Lonely.  Youngest D PHD psychologist and just got married.  3rd D is still in school in PHD and causes some stress.  2nd D in [place] and has GD.  Oldest D NP with [facility].   Disc withdrawal with Adderall.  Still takes longer than he wants to do things.   Plan no med changes  12/15/2020 appointment with the following noted: Occ feels winded but cardiologist says he's OK.  Energy is variable from good to not so good.  Sleep seems  pretty good overall.  Admits he still has too much caffeine and often after 6 PM.  Would like to meet a woman. Asks about meds and tiredness. Overall mood and anxiety are OK.  Drags some in the morning wondering what to do with the day.  Good interest and enjoyment.   Plan: Continue lithium 300 mg daily.  Lexapro 10 mg daily and lamotrigine 100 mg daily. And Adderall 30 mg TID No med changes today except trial reduction in lamotrigine to 100 mg daily to see if energy is better.  07/02/2021 appointment with the following noted: Mood has been OK.  Mostly same old issues.  Need to work on negative thought process.  Still looking for savior girl friend to rescue me but realizes it's not the right way to think. Hard to keep the house clean.  44 yo grand daughter said his kitchen was a mess.   Still chronically sleepy and tired. Usually sleep average 7 hours. No unusual mood swings.  Stays busy.  Patient reports stable mood and denies irritable moods.  No manic symptoms.  Patient denies any recent difficulty with anxiety.  Patient denies difficulty with sleep initiation or maintenance. 6-8 hours sleep.   Denies appetite disturbance.  Patient reports that  motivation have been good.  Patient still has problems with concentration.  Patient denies any suicidal ideation. Plan: no changes  10/05/21 appt noted:   More tired than he used to be.  Pending PE.  Usually chronically active.  Usually exercises.  A little worried.   Chronically scattered and difficulty getting things done.  Recognizes still hoarder.  Wonders if still depreessed but staff says he looks better.  Not hopeless or helpless.  Recognizes procrastination.   Drinks a lot of caffeine and has the Adderall.   Plans to continue cycling.    04/06/22 appt noted: Crashes if out of Adderall and dealing with shortage.  Missed some this week. Can function so much better with it. Worries about some chronic tiredness and some SOB.  Some worry. Notices  some differences with generic Adderall.  White ones less effective than orange ones. Going to [place] August and to visit's mother's home town. B may go too. Enjoys new riding Associate Professor.  Has 6 others. Mood has been ok.  No swings. Chronic procrastination.  Ongoing but some progress in areas. Drinks energy sodas at times.   Satisfied with meds.   09/23/2022 appointment noted: Psych meds: Adderall 30 mg 3 times daily for severe ADD, fluoxetine 20 mg daily, lamotrigine 100 mg daily No worse than usual.  Nothing spectacular since here.  Still working and looking for a soul mate.  Not very active socially.   No sig heart issues and health.  Good endurance. Mood good.  No unusual anxiety.  Sleep ok.   No SE.   Gets sleepy when 3rd Adderall wears off. Plan: No med changes:  Continue Adderall 30 mg 3 times daily for severe ADD, fluoxetine 20 mg daily, lamotrigine 100 mg daily  05/12/23 appt noted: D on Adderall.  [name] concerned she is paranoid.  Needs new doc. She withdrew from school.  Concerned she might be paranoid.  Dep at times bc lonely.  But not clinical.  Satisfied with meds.  Would like a relationship.  Still enjoys things and home.  Function ok except ADD long term.   Chronically scattered.  Still needs ADD meds.   No SE.  Health ok except ? Glaucoma. DM borderline.  Testosterone 350.   Anxiety about the same bc struggles with chronic lateness.  If not under schedule then not anxious.   Borderline OSA.  CPAP didn't help much.   No sig sodas and less coffee.  Past Psychiatric Medication Trials: Modafinil, Strattera tremor, Wellbutrin, Vyvanse, Ritalin,  Adzenys, Adderall 30 TID, Aricept off label without benefit,  lamotrigine,  Depakote tremor,   Lexapro 10  Review of Systems:  Review of Systems  Constitutional:  Positive for fatigue.  Cardiovascular:  Negative for palpitations.  Neurological:  Negative for tremors.  Psychiatric/Behavioral:  Positive for decreased  concentration.     Medications: I have reviewed the patient's current medications.  Current Outpatient Medications  Medication Sig Dispense Refill   atorvastatin (LIPITOR) 10 MG tablet TAKE 1 TABLET BY MOUTH DAILY. PLEASE CALL OFFICE FOR FURTHER REFILLS 90 tablet 0   Cholecalciferol (VITAMIN D3) 1.25 MG (50000 UT) CAPS Take 50,000 Units by mouth once a week.     lamoTRIgine (LAMICTAL) 100 MG tablet Take 1 tablet (100 mg total) by mouth daily. 90 tablet 3   Multiple Vitamin (MULTIVITAMIN) capsule Take 1 capsule by mouth daily.     naproxen (NAPROSYN) 500 MG tablet Take 1 tablet by mouth as needed.     Omega 3 1200 MG CAPS Take by mouth. 1-2 caps daily     [START ON 07/07/2023] amphetamine-dextroamphetamine (ADDERALL) 30 MG tablet  Take 1 tablet by mouth 3 (three) times daily. 30 tablet 0   amphetamine-dextroamphetamine (ADDERALL) 30 MG tablet Take 1 tablet by mouth 3 (three) times daily. 30 tablet 0   [START ON 06/09/2023] amphetamine-dextroamphetamine (ADDERALL) 30 MG tablet Take 1 tablet by mouth 3 (three) times daily. 90 tablet 0   FLUoxetine (PROZAC) 20 MG capsule Take 1 capsule (20 mg total) by mouth daily. 90 capsule 1   No current facility-administered medications for this visit.    Medication Side Effects: None  Allergies: No Known Allergies  Past Medical History:  Diagnosis Date   ADHD    Anxiety and depression    Hyperlipidemia    Vitamin D insufficiency     Family History  Problem Relation Age of Onset   Heart failure Father    Diabetes Father    Diabetes Sister    Bipolar disorder Sister    Heart disease Brother    Hepatitis C Brother    Depression Brother     Social History   Socioeconomic History   Marital status: Divorced    Spouse name: Not on file   Number of children: 4   Years of education: Not on file   Highest education level: Not on file  Occupational History   Not on file  Tobacco Use   Smoking status: Never   Smokeless tobacco: Never   Vaping Use   Vaping status: Never Used  Substance and Sexual Activity   Alcohol use: Yes    Alcohol/week: 7.0 standard drinks of alcohol    Types: 7 Shots of liquor per week    Comment: Occasional   Drug use: Never   Sexual activity: Not on file  Other Topics Concern   Not on file  Social History Narrative   Not on file   Social Determinants of Health   Financial Resource Strain: Not on file  Food Insecurity: Not on file  Transportation Needs: Not on file  Physical Activity: Not on file  Stress: Not on file  Social Connections: Not on file  Intimate Partner Violence: Not on file    Past Medical History, Surgical history, Social history, and Family history were reviewed and updated as appropriate.   Please see review of systems for further details on the patient's review from today.   Objective:   Physical Exam:  There were no vitals taken for this visit.  Physical Exam Constitutional:      General: He is not in acute distress. Musculoskeletal:        General: No deformity.  Neurological:     Mental Status: He is alert and oriented to person, place, and time.     Cranial Nerves: No dysarthria.     Coordination: Coordination normal.  Psychiatric:        Attention and Perception: Attention and perception normal. He does not perceive auditory or visual hallucinations.        Mood and Affect: Mood is anxious. Mood is not depressed. Affect is not labile.        Speech: Speech normal.        Behavior: Behavior normal. Behavior is cooperative.        Thought Content: Thought content normal. Thought content is not delusional. Thought content does not include homicidal or suicidal ideation.        Cognition and Memory: Cognition and memory normal.        Judgment: Judgment normal.     Comments: Insight good.   Chronic OCD sx  incl cleaning and hoarding but dreads it and procrastinates.  Not worse.     Lab Review:     Component Value Date/Time   NA 139 11/15/2018 1235    K 4.2 11/15/2018 1235   CL 104 11/15/2018 1235   CO2 26 11/15/2018 1235   GLUCOSE 117 (H) 11/15/2018 1235   BUN 22 11/15/2018 1235   CREATININE 1.10 11/15/2018 1235   CALCIUM 9.4 11/15/2018 1235   GFRNONAA 69 11/15/2018 1235   GFRAA 80 11/15/2018 1235       Component Value Date/Time   WBC 7.4 11/15/2018 1235   RBC 4.21 11/15/2018 1235   HGB 13.0 (L) 11/15/2018 1235   HCT 39.4 11/15/2018 1235   PLT 287 11/15/2018 1235   MCV 93.6 11/15/2018 1235   MCH 30.9 11/15/2018 1235   MCHC 33.0 11/15/2018 1235   RDW 13.2 11/15/2018 1235   LYMPHSABS 1,029 11/15/2018 1235   EOSABS 104 11/15/2018 1235   BASOSABS 30 11/15/2018 1235    No results found for: "POCLITH", "LITHIUM"   No results found for: "PHENYTOIN", "PHENOBARB", "VALPROATE", "CBMZ"   .res Assessment: Plan:    Larence was seen today for follow-up.  Diagnoses and all orders for this visit:  Mild recurrent major depression (HCC) -     FLUoxetine (PROZAC) 20 MG capsule; Take 1 capsule (20 mg total) by mouth daily.  Attention deficit hyperactivity disorder (ADHD), combined type -     amphetamine-dextroamphetamine (ADDERALL) 30 MG tablet; Take 1 tablet by mouth 3 (three) times daily. -     amphetamine-dextroamphetamine (ADDERALL) 30 MG tablet; Take 1 tablet by mouth 3 (three) times daily. -     amphetamine-dextroamphetamine (ADDERALL) 30 MG tablet; Take 1 tablet by mouth 3 (three) times daily.  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  Generalized anxiety disorder  Mixed obsessional thoughts and acts -     FLUoxetine (PROZAC) 20 MG capsule; Take 1 capsule (20 mg total) by mouth daily.    Continue fluoxetine 20 mg daily or some depressive symptoms and obsessive compulsive traits which he thinks interferes with his overall productivity.  He has taken this medication in the past and has noticed benefit  again this time..  Discussed side effects of SSRIs.  Disc normal dosage range.  No evidence for mania in recent years.  Cont to work on getting rid of clutter.  [name] made some progress.  some chronic OCD tendencies.  But SSRIs not that great for hoarding.    For cost reasons he has stopped Vyvanse and switched to Adderall 30 mg 3 times daily.  He is aware this is a high dose but feels it is medically necessary.  His ADD has been severe and chronic and it is appropriate.  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    He is going to follow-up with his cardiologist as a matter of routine discussed the risk of between dosage sedation if he is not careful to space them out.  He is aware.  Reduce caffeine and none after 3 PM.    Discussed potential benefits, risks, and side effects of stimulants with patient to include increased heart rate, palpitations, insomnia, increased anxiety, increased irritability, or decreased appetite.  Instructed patient to contact office if experiencing any significant tolerability issues.  Disc withdrawal if consistent.  L-methylfolate was too expensive.  He could consider it again using good Rx but that is not convenient to him.  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    No med changes:  Continue Adderall 30 mg 3 times daily for severe ADD, fluoxetine 20 mg daily, lamotrigine 100 mg daily  He plans to continue vitamin D.  He is continuing counseling with Dr. Laurell Roof. We discussed ADHD strategies.  Follow-up 6 months  Iona Hansen, MD, DFAPA   Please see After Visit Summary for patient specific instructions.  No future appointments.   No orders of the defined types were placed in this encounter.      -------------------------------

## 2023-05-17 DIAGNOSIS — F5101 Primary insomnia: Secondary | ICD-10-CM | POA: Diagnosis not present

## 2023-05-17 DIAGNOSIS — G4733 Obstructive sleep apnea (adult) (pediatric): Secondary | ICD-10-CM | POA: Diagnosis not present

## 2023-05-19 DIAGNOSIS — G4733 Obstructive sleep apnea (adult) (pediatric): Secondary | ICD-10-CM | POA: Diagnosis not present

## 2023-05-23 ENCOUNTER — Other Ambulatory Visit: Payer: Self-pay

## 2023-05-23 ENCOUNTER — Telehealth: Payer: Self-pay | Admitting: Psychiatry

## 2023-05-23 DIAGNOSIS — F902 Attention-deficit hyperactivity disorder, combined type: Secondary | ICD-10-CM

## 2023-05-23 MED ORDER — AMPHETAMINE-DEXTROAMPHETAMINE 30 MG PO TABS: 30.0000 mg | ORAL_TABLET | Freq: Three times a day (TID) | ORAL | 0 refills | Status: DC

## 2023-05-23 NOTE — Telephone Encounter (Signed)
Rxs for Adderall on 9/12 and 11/7 are for #30 pills, not #90. He had already picked up the #30 tabs on the 05/12/23 RX and is running low. Can we send in a new one to cover the difference and Can we please correct the 07/07/23 RX? [id number] [place], [place] - [address] AT [facility]

## 2023-05-23 NOTE — Telephone Encounter (Signed)
Canceled November Rx, pended RF.

## 2023-06-15 DIAGNOSIS — Z6824 Body mass index (BMI) 24.0-24.9, adult: Secondary | ICD-10-CM | POA: Diagnosis not present

## 2023-06-15 DIAGNOSIS — I700 Atherosclerosis of aorta: Secondary | ICD-10-CM | POA: Diagnosis not present

## 2023-06-15 DIAGNOSIS — Z9181 History of falling: Secondary | ICD-10-CM | POA: Diagnosis not present

## 2023-06-15 DIAGNOSIS — Z0000 Encounter for general adult medical examination without abnormal findings: Secondary | ICD-10-CM | POA: Diagnosis not present

## 2023-06-16 DIAGNOSIS — F5101 Primary insomnia: Secondary | ICD-10-CM | POA: Diagnosis not present

## 2023-06-16 DIAGNOSIS — G4733 Obstructive sleep apnea (adult) (pediatric): Secondary | ICD-10-CM | POA: Diagnosis not present

## 2023-07-14 DIAGNOSIS — H04123 Dry eye syndrome of bilateral lacrimal glands: Secondary | ICD-10-CM | POA: Diagnosis not present

## 2023-07-14 DIAGNOSIS — Z961 Presence of intraocular lens: Secondary | ICD-10-CM | POA: Diagnosis not present

## 2023-07-14 DIAGNOSIS — H532 Diplopia: Secondary | ICD-10-CM | POA: Diagnosis not present

## 2023-07-14 DIAGNOSIS — H353131 Nonexudative age-related macular degeneration, bilateral, early dry stage: Secondary | ICD-10-CM | POA: Diagnosis not present

## 2023-07-14 DIAGNOSIS — H40053 Ocular hypertension, bilateral: Secondary | ICD-10-CM | POA: Diagnosis not present

## 2023-07-14 DIAGNOSIS — H43813 Vitreous degeneration, bilateral: Secondary | ICD-10-CM | POA: Diagnosis not present

## 2023-07-17 DIAGNOSIS — F5101 Primary insomnia: Secondary | ICD-10-CM | POA: Diagnosis not present

## 2023-07-17 DIAGNOSIS — G4733 Obstructive sleep apnea (adult) (pediatric): Secondary | ICD-10-CM | POA: Diagnosis not present

## 2023-07-22 ENCOUNTER — Other Ambulatory Visit: Payer: Self-pay

## 2023-07-22 ENCOUNTER — Telehealth: Payer: Self-pay | Admitting: Psychiatry

## 2023-07-22 DIAGNOSIS — F902 Attention-deficit hyperactivity disorder, combined type: Secondary | ICD-10-CM

## 2023-07-22 MED ORDER — AMPHETAMINE-DEXTROAMPHETAMINE 30 MG PO TABS: 30.0000 mg | ORAL_TABLET | Freq: Three times a day (TID) | ORAL | 0 refills | Status: DC

## 2023-07-22 NOTE — Telephone Encounter (Signed)
Pt LVM @ 2:55p requesting Adderall 30mg  script get sent to [facility].  [facility] [place] [facility]'t [facility] in [facility].  Next appt 3/13

## 2023-07-22 NOTE — Telephone Encounter (Signed)
Canceled [facility]all at [facility] in [place] and repended to [facility].

## 2023-08-16 DIAGNOSIS — F5101 Primary insomnia: Secondary | ICD-10-CM | POA: Diagnosis not present

## 2023-08-16 DIAGNOSIS — G4733 Obstructive sleep apnea (adult) (pediatric): Secondary | ICD-10-CM | POA: Diagnosis not present

## 2023-08-19 ENCOUNTER — Telehealth: Payer: Self-pay | Admitting: Psychiatry

## 2023-08-19 ENCOUNTER — Other Ambulatory Visit: Payer: Self-pay

## 2023-08-19 DIAGNOSIS — F902 Attention-deficit hyperactivity disorder, combined type: Secondary | ICD-10-CM

## 2023-08-19 MED ORDER — AMPHETAMINE-DEXTROAMPHETAMINE 30 MG PO TABS: 30.0000 mg | ORAL_TABLET | Freq: Three times a day (TID) | ORAL | 0 refills | Status: DC

## 2023-08-19 MED ORDER — AMPHETAMINE-DEXTROAMPHETAMINE 30 MG PO TABS
30.0000 mg | ORAL_TABLET | Freq: Three times a day (TID) | ORAL | 0 refills | Status: DC
Start: 2023-10-14 — End: 2023-11-10

## 2023-08-19 NOTE — Telephone Encounter (Signed)
[facility] in [place].

## 2023-08-19 NOTE — Telephone Encounter (Signed)
Patient called in for refill on Adderall 30mg . States that pharmacy was out of stock and now its back in stock he needs [facility] [address] Ph: [phone] Appt 3/13

## 2023-09-16 DIAGNOSIS — F5101 Primary insomnia: Secondary | ICD-10-CM | POA: Diagnosis not present

## 2023-09-16 DIAGNOSIS — G4733 Obstructive sleep apnea (adult) (pediatric): Secondary | ICD-10-CM | POA: Diagnosis not present

## 2023-09-29 ENCOUNTER — Encounter: Payer: Self-pay | Admitting: *Deleted

## 2023-09-30 ENCOUNTER — Encounter: Payer: Self-pay | Admitting: Diagnostic Neuroimaging

## 2023-09-30 ENCOUNTER — Other Ambulatory Visit: Payer: Self-pay | Admitting: *Deleted

## 2023-09-30 ENCOUNTER — Ambulatory Visit (INDEPENDENT_AMBULATORY_CARE_PROVIDER_SITE_OTHER): Payer: Medicare HMO | Admitting: Diagnostic Neuroimaging

## 2023-09-30 VITALS — BP 110/67 | HR 67 | Ht 68.0 in | Wt 161.2 lb

## 2023-09-30 DIAGNOSIS — H532 Diplopia: Secondary | ICD-10-CM | POA: Diagnosis not present

## 2023-09-30 DIAGNOSIS — D582 Other hemoglobinopathies: Secondary | ICD-10-CM | POA: Diagnosis not present

## 2023-09-30 NOTE — Progress Notes (Signed)
[facility]  PATIENT: Justin Marshall DOB: [birth year]  REFERRING CLINICIAN: Ernesto Rutherford, MD HISTORY FROM: PATIENT  REASON FOR VISIT: NEW CONSULT   HISTORICAL  CHIEF COMPLAINT:  Chief Complaint  Patient presents with   Room 6    Pt is here Alone. Pt states that his symptoms started 3 months ago. Pt states that he hasn't had anymore issues with his vision or seeing 2 images. Pt denies any migraines and headaches. Pt states that he has some fatigue.     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  06/28/2023 patient was driving when all of a sudden he had horizontal double vision.  He pulled over and took some pictures of himself showing some misalignment of his eyes.  This last about 20 to 30 minutes and then resolved.  Since then no recurrent symptoms.  No speech or swallowing difficulty.  No weakness in arms legs.  Has been struggling with some dyspnea on exertion for several years.    REVIEW OF SYSTEMS: Full 14 system review of systems performed and negative with exception of: as per HPI.  ALLERGIES: No Known Allergies  HOME MEDICATIONS: Outpatient Medications Prior to Visit  Medication Sig Dispense Refill   [START ON 10/14/2023] amphetamine-dextroamphetamine (ADDERALL) 30 MG tablet Take 1 tablet by mouth 3 (three) times daily. 90 tablet 0   amphetamine-dextroamphetamine (ADDERALL) 30 MG tablet Take 1 tablet by mouth 3 (three) times daily. 90 tablet 0   amphetamine-dextroamphetamine (ADDERALL) 30 MG tablet Take 1 tablet by mouth 3 (three) times daily. 90 tablet 0   atorvastatin (LIPITOR) 10 MG tablet TAKE 1 TABLET BY MOUTH DAILY. PLEASE CALL OFFICE FOR FURTHER REFILLS 90 tablet 0   Cholecalciferol (VITAMIN D3) 1.25 MG (50000 UT) CAPS Take 50,000 Units by mouth once a week.     FLUoxetine (PROZAC) 20 MG capsule Take 1 capsule (20 mg total) by mouth daily. 90 capsule 1   lamoTRIgine (LAMICTAL) 100  MG tablet Take 1 tablet (100 mg total) by mouth daily. 90 tablet 3   Multiple Vitamin (MULTIVITAMIN) capsule Take 1 capsule by mouth daily.     naproxen (NAPROSYN) 500 MG tablet Take 1 tablet by mouth as needed.     Omega 3 1200 MG CAPS Take by mouth. 1-2 caps daily (Patient not taking: Reported on 09/30/2023)     No facility-administered medications prior to visit.    PAST MEDICAL HISTORY: Past Medical History:  Diagnosis Date   ADHD    Anxiety and depression    Hyperlipidemia    Vitamin D insufficiency     PAST SURGICAL HISTORY: Past Surgical History:  Procedure Laterality Date   MITRAL VALVE REPAIR  07/18/2005    FAMILY HISTORY: Family History  Problem Relation Age of Onset   Heart failure Father    Diabetes Father    Diabetes Sister    Bipolar disorder Sister    Heart disease Brother    Hepatitis C Brother    Depression Brother     SOCIAL HISTORY: Social History   Socioeconomic History   Marital status: Divorced    Spouse name: Not on file   Number of children: 4   Years of education: Not on file   Highest education level: Not on file  Occupational History   Not on file  Tobacco Use   Smoking status: Never   Smokeless tobacco: Never  Vaping Use   Vaping status: Never Used  Substance  and Sexual Activity   Alcohol use: Yes    Alcohol/week: 7.0 standard drinks of alcohol    Types: 7 Shots of liquor per week    Comment: Occasional   Drug use: Never   Sexual activity: Not on file  Other Topics Concern   Not on file  Social History Narrative   Not on file   Social Drivers of Health   Financial Resource Strain: Not on file  Food Insecurity: Not on file  Transportation Needs: Not on file  Physical Activity: Not on file  Stress: Not on file  Social Connections: Not on file  Intimate Partner Violence: Not on file     PHYSICAL EXAM  GENERAL EXAM/CONSTITUTIONAL: Vitals:  Vitals:   09/30/23 1105  BP: 110/67  Pulse: 67  Weight: 161 lb 3.2 oz  (73.1 kg)  Height: 5\' 8"  (1.727 m)   Body mass index is 24.51 kg/m. Wt Readings from Last 3 Encounters:  09/30/23 161 lb 3.2 oz (73.1 kg)  12/02/21 155 lb (70.3 kg)  10/21/21 153 lb 14.4 oz (69.8 kg)   Patient is in no distress; well developed, nourished and groomed; neck is supple  CARDIOVASCULAR: Examination of carotid arteries is normal; no carotid bruits Regular rate and rhythm, no murmurs Examination of peripheral vascular system by observation and palpation is normal  EYES: Ophthalmoscopic exam of optic discs and posterior segments is normal; no papilledema or hemorrhages No results found.  MUSCULOSKELETAL: Gait, strength, tone, movements noted in Neurologic exam below  NEUROLOGIC: MENTAL STATUS:      No data to display         awake, alert, oriented to person, place and time recent and remote memory intact normal attention and concentration language fluent, comprehension intact, naming intact fund of knowledge appropriate  CRANIAL NERVE:  2nd - no papilledema on fundoscopic exam 2nd, 3rd, 4th, 6th - pupils equal and reactive to light, visual fields full to confrontation, extraocular muscles intact, no nystagmus 5th - facial sensation symmetric 7th - facial strength symmetric 8th - hearing intact 9th - palate elevates symmetrically, uvula midline 11th - shoulder shrug symmetric 12th - tongue protrusion midline  MOTOR:  normal bulk and tone, full strength in the BUE, BLE  SENSORY:  normal and symmetric to light touch, pinprick, temperature, vibration  COORDINATION:  finger-nose-finger, fine finger movements normal  REFLEXES:  deep tendon reflexes present and symmetric  GAIT/STATION:  narrow based gait; able to walk on toes, heels and tandem; romberg is negative     DIAGNOSTIC DATA (LABS, IMAGING, TESTING) - I reviewed patient records, labs, notes, testing and imaging myself where available.  Lab Results  Component Value Date   WBC 7.4  11/15/2018   HGB 13.0 (L) 11/15/2018   HCT 39.4 11/15/2018   MCV 93.6 11/15/2018   PLT 287 11/15/2018      Component Value Date/Time   NA 139 11/15/2018 1235   K 4.2 11/15/2018 1235   CL 104 11/15/2018 1235   CO2 26 11/15/2018 1235   GLUCOSE 117 (H) 11/15/2018 1235   BUN 22 11/15/2018 1235   CREATININE 1.10 11/15/2018 1235   CALCIUM 9.4 11/15/2018 1235   GFRNONAA 69 11/15/2018 1235   GFRAA 80 11/15/2018 1235   No results found for: "CHOL", "HDL", "LDLCALC", "LDLDIRECT", "TRIG", "CHOLHDL" Lab Results  Component Value Date   HGBA1C 5.7 (H) 11/15/2018   No results found for: "VITAMINB12" No results found for: "TSH"     ASSESSMENT AND PLAN  73 y.o. year  old male here with:   Dx:  1. Double vision with both eyes open     PLAN:  Transient double vision (06/28/23; TIA vs neuromuscular dz event such as myasthenia gravis) - continue statin; start aspirin 81mg  daily - check MRI brain, MRA head, neck (will need to verify MRI safety given his mitral valve repair surgery in 2006 which he states may or may not be MRI compatible) - check AchR ab, CK, aldolase  Orders Placed This Encounter  Procedures   MR BRAIN W WO CONTRAST   MR ANGIO HEAD WO CONTRAST   MR ANGIO NECK W WO CONTRAST   AChR Abs with Reflex to MuSK   CK   Aldolase   Return for pending if symptoms worsen or fail to improve, pending test results.    Suanne Marker, MD 09/30/2023, 11:47 AM Certified in Neurology, Neurophysiology and Neuroimaging  [facility] [address] [place], [place] [address] [phone]

## 2023-09-30 NOTE — Patient Instructions (Signed)
Transient double vision (06/28/23; TIA vs neuromuscular dz event such as myasthenia gravis) - continue statin; start aspirin 81mg  daily - check MRI brain, MRA head, neck - check AchR ab, CK, aldolase

## 2023-10-01 LAB — CBC WITH DIFFERENTIAL/PLATELET
Basophils Absolute: 0 10*3/uL (ref 0.0–0.2)
Basos: 1 %
EOS (ABSOLUTE): 0.1 10*3/uL (ref 0.0–0.4)
Eos: 2 %
Hematocrit: 41.5 % (ref 37.5–51.0)
Hemoglobin: 13.7 g/dL (ref 13.0–17.7)
Immature Grans (Abs): 0 10*3/uL (ref 0.0–0.1)
Immature Granulocytes: 0 %
Lymphocytes Absolute: 1.2 10*3/uL (ref 0.7–3.1)
Lymphs: 20 %
MCH: 33.2 pg — ABNORMAL HIGH (ref 26.6–33.0)
MCHC: 33 g/dL (ref 31.5–35.7)
MCV: 101 fL — ABNORMAL HIGH (ref 79–97)
Monocytes Absolute: 0.8 10*3/uL (ref 0.1–0.9)
Monocytes: 13 %
Neutrophils Absolute: 4 10*3/uL (ref 1.4–7.0)
Neutrophils: 64 %
Platelets: 278 10*3/uL (ref 150–450)
RBC: 4.13 x10E6/uL — ABNORMAL LOW (ref 4.14–5.80)
RDW: 12.9 % (ref 11.6–15.4)
WBC: 6.2 10*3/uL (ref 3.4–10.8)

## 2023-10-04 ENCOUNTER — Telehealth: Payer: Self-pay | Admitting: Diagnostic Neuroimaging

## 2023-10-04 NOTE — Telephone Encounter (Signed)
 sent to GI they obtain [facility] Berkley Harvey [phone]

## 2023-10-13 LAB — MUSK ANTIBODIES: MuSK Antibodies: <1 U/mL

## 2023-10-13 LAB — ALDOLASE: Aldolase: 5.9 U/L (ref 3.3–10.3)

## 2023-10-13 LAB — ACHR ABS WITH REFLEX TO MUSK: AChR Binding Ab, Serum: <0.03 nmol/L (ref 0.00–0.24)

## 2023-10-13 LAB — CK: Total CK: 80 U/L (ref 41–331)

## 2023-10-13 NOTE — Progress Notes (Signed)
Labs are good, no major findings. -VRP

## 2023-10-17 DIAGNOSIS — G4733 Obstructive sleep apnea (adult) (pediatric): Secondary | ICD-10-CM | POA: Diagnosis not present

## 2023-10-17 DIAGNOSIS — F5101 Primary insomnia: Secondary | ICD-10-CM | POA: Diagnosis not present

## 2023-10-24 ENCOUNTER — Telehealth: Payer: Self-pay | Admitting: Psychiatry

## 2023-10-24 NOTE — Telephone Encounter (Signed)
 Pt said he was told by pharmacy he didn't have a RF. I verified with pharmacy that he did have a RF and asked them to fill it.

## 2023-10-24 NOTE — Telephone Encounter (Signed)
 Next visit is 11/10/23. Requesting refill on Adderall 30 mg called to:  [id number] [place], [place] - [address] AT [facility]   Phone: [phone]  Fax: [phone]

## 2023-10-24 NOTE — Telephone Encounter (Signed)
 Pt has a RF available at the pharmacy. LVM with info.

## 2023-11-03 ENCOUNTER — Other Ambulatory Visit: Payer: Self-pay | Admitting: Psychiatry

## 2023-11-03 DIAGNOSIS — F330 Major depressive disorder, recurrent, mild: Secondary | ICD-10-CM

## 2023-11-03 DIAGNOSIS — F423 Hoarding disorder: Secondary | ICD-10-CM

## 2023-11-03 DIAGNOSIS — F422 Mixed obsessional thoughts and acts: Secondary | ICD-10-CM

## 2023-11-10 ENCOUNTER — Ambulatory Visit (INDEPENDENT_AMBULATORY_CARE_PROVIDER_SITE_OTHER): Payer: Medicare HMO | Admitting: Psychiatry

## 2023-11-10 ENCOUNTER — Encounter: Payer: Self-pay | Admitting: Psychiatry

## 2023-11-10 DIAGNOSIS — F411 Generalized anxiety disorder: Secondary | ICD-10-CM | POA: Diagnosis not present

## 2023-11-10 DIAGNOSIS — F902 Attention-deficit hyperactivity disorder, combined type: Secondary | ICD-10-CM | POA: Diagnosis not present

## 2023-11-10 DIAGNOSIS — F422 Mixed obsessional thoughts and acts: Secondary | ICD-10-CM | POA: Diagnosis not present

## 2023-11-10 DIAGNOSIS — F330 Major depressive disorder, recurrent, mild: Secondary | ICD-10-CM | POA: Diagnosis not present

## 2023-11-10 DIAGNOSIS — F423 Hoarding disorder: Secondary | ICD-10-CM | POA: Diagnosis not present

## 2023-11-10 MED ORDER — AMPHETAMINE-DEXTROAMPHETAMINE 30 MG PO TABS: 30.0000 mg | ORAL_TABLET | Freq: Three times a day (TID) | ORAL | 0 refills | Status: DC

## 2023-11-10 NOTE — Progress Notes (Signed)
 Justin Marshall [id number] April 05, 1951 73 y.o. Subjective:   Subjective:   Patient ID:  Justin Marshall is a 73 y.o. Subjective: (DOB [birth year]) male. 73 y.o. Subjective:  Chief Complaint:  Chief Complaint  Patient presents with   Follow-up    HPI Justin Marshall presents for follow-up of their ADD, history of anxiety and history of depression.  At visit Jan 10, 2019.  We initiated a return to Lexapro 20 mg for some depressive and obsessive symptoms.  Justin Marshall was alternating between Vyvanse and Adderall because of cost.  12/12/19 appt with following noted: Pretty good overall.  Good sleep.  L-methylfolate too expensive.  Iron level low.  Taking vitamin D usually. D got on his nerves decluttering him and throwing things away.     Gets distracted cleaning and straightening up.   Semi retired working 3 days a week. Feels better with less depression and less negative and less obsessive negative thinking.  His clinic closed bc of Covid affecting the clinic.  Still struggles getting things done bc overwhelmed with demands.  May be a little anemic and that causes some tiredness.  B12 is normal.   Riding bike.  Tend to be depressed more than Justin Marshall wants to be.  Can be depressed and some obsessiveness and can't let go of things.   Plan: For cost reasons Justin Marshall has stopped Vyvanse and switched to Adderall 30 mg 3 times daily.   Continue Lexapro 10 and lithium 300 mg daily and lamotrigine 150. No med changes today.  06/30/2020 appointment with the following noted: No covid.  Vaccinated. Overall maintaining mood.  Lonely.  Youngest D PHD psychologist and just got married.  3rd D is still in school in PHD and causes some stress.  2nd D in [place] and has GD.  Oldest D NP with [facility].   Disc withdrawal with Adderall.  Still takes longer than Justin Marshall wants to do things.   Plan no med changes  12/15/2020 appointment with the following noted: Occ feels winded but cardiologist says Justin Marshall's OK.  Energy is variable from good to not so good.  Sleep seems  pretty good overall.  Admits Justin Marshall still has too much caffeine and often after 6 PM.  Would like to meet a woman. Asks about meds and tiredness. Overall mood and anxiety are OK.  Drags some in the morning wondering what to do with the day.  Good interest and enjoyment.   Plan: Continue lithium 300 mg daily.  Lexapro 10 mg daily and lamotrigine 100 mg daily. And Adderall 30 mg TID No med changes today except trial reduction in lamotrigine to 100 mg daily to see if energy is better.  07/02/2021 appointment with the following noted: Mood has been OK.  Mostly same old issues.  Need to work on negative thought process.  Still looking for savior girl friend to rescue me but realizes it's not the right way to think. Hard to keep the house clean.  34 yo grand daughter said his kitchen was a mess.   Still chronically sleepy and tired. Usually sleep average 7 hours. No unusual mood swings.  Stays busy.  Patient reports stable mood and denies irritable moods.  No manic symptoms.  Patient denies any recent difficulty with anxiety.  Patient denies difficulty with sleep initiation or maintenance. 6-8 hours sleep.   Denies appetite disturbance.  Patient reports that  motivation have been good.  Patient still has problems with concentration.  Patient denies any suicidal ideation. Plan: no changes  10/05/21 appt noted:   More tired than Justin Marshall used to be.  Pending PE.  Usually chronically active.  Usually exercises.  A little worried.   Chronically scattered and difficulty getting things done.  Recognizes still hoarder.  Wonders if still depreessed but staff says Justin Marshall looks better.  Not hopeless or helpless.  Recognizes procrastination.   Drinks a lot of caffeine and has the Adderall.   Plans to continue cycling.    04/06/22 appt noted: Crashes if out of Adderall and dealing with shortage.  Missed some this week. Can function so much better with it. Worries about some chronic tiredness and some SOB.  Some worry. Notices  some differences with generic Adderall.  White ones less effective than orange ones. Going to [place] August and to visit's mother's home town. B may go too. Enjoys new riding Associate Professor.  Has 6 others. Mood has been ok.  No swings. Chronic procrastination.  Ongoing but some progress in areas. Drinks energy sodas at times.   Satisfied with meds.   09/23/2022 appointment noted: Psych meds: Adderall 30 mg 3 times daily for severe ADD, fluoxetine 20 mg daily, lamotrigine 100 mg daily No worse than usual.  Nothing spectacular since here.  Still working and looking for a soul mate.  Not very active socially.   No sig heart issues and health.  Good endurance. Mood good.  No unusual anxiety.  Sleep ok.   No SE.   Gets sleepy when 3rd Adderall wears off. Plan: No med changes:  Continue Adderall 30 mg 3 times daily for severe ADD, fluoxetine 20 mg daily, lamotrigine 100 mg daily  05/12/23 appt noted: D on Adderall.  Justin Marshall's concerned she is paranoid.  Needs new doc. She withdrew from school.  Concerned she might be paranoid.  Dep at times bc lonely.  But not clinical.  Satisfied with meds.  Would like a relationship.  Still enjoys things and home.  Function ok except ADD long term.   Chronically scattered.  Still needs ADD meds.   No SE.  Health ok except ? Glaucoma. DM borderline.  Testosterone 350.   Anxiety about the same bc struggles with chronic lateness.  If not under schedule then not anxious.   Borderline OSA.  CPAP didn't help much.   No sig sodas and less coffee.  11/10/23 appt noted: Med:  Continue Adderall 30 mg 3 times daily for severe ADD, fluoxetine 20 mg daily, lamotrigine 100 mg daily Stress with paranoid D not getting help.  She's in his rental and Justin Marshall pays utilities.   Would like partner in life. On meds feels normal and don't fly off handle as much .   Chronic stress with ADD and not getting things done the way Justin Marshall'd like.  Starts projects Justin Marshall doesn't finish.   Still pleased  with meds.   Sleep ok except when Justin Marshall disrupts his own cycyle with caffeine.  Uses CPAP sporadically.   Past Psychiatric Medication Trials:  Modafinil, Strattera tremor, Wellbutrin, Vyvanse, Ritalin,  Adzenys, Adderall 30 TID, Aricept off label without benefit,  lamotrigine,  Depakote tremor,   Lexapro 10 Fluoxetine 20  Review of Systems:  Review of Systems  Constitutional:  Positive for fatigue.  Cardiovascular:  Negative for palpitations.  Neurological:  Negative for tremors.  Psychiatric/Behavioral:  Positive for decreased concentration and sleep disturbance.     Medications: I have reviewed the patient's current medications.  Current Outpatient Medications  Medication Sig Dispense Refill   atorvastatin (LIPITOR) 10 MG  tablet TAKE 1 TABLET BY MOUTH DAILY. PLEASE CALL OFFICE FOR FURTHER REFILLS 90 tablet 0   Cholecalciferol (VITAMIN D3) 1.25 MG (50000 UT) CAPS Take 50,000 Units by mouth once a week.     FLUoxetine (PROZAC) 20 MG capsule TAKE 1 CAPSULE BY MOUTH EVERY DAY 90 capsule 1   lamoTRIgine (LAMICTAL) 100 MG tablet TAKE 1 TABLET BY MOUTH EVERY DAY 90 tablet 3   Multiple Vitamin (MULTIVITAMIN) capsule Take 1 capsule by mouth daily.     naproxen (NAPROSYN) 500 MG tablet Take 1 tablet by mouth as needed.     Omega 3 1200 MG CAPS Take by mouth. 1-2 caps daily     amphetamine-dextroamphetamine (ADDERALL) 30 MG tablet Take 1 tablet by mouth 3 (three) times daily. 90 tablet 0   [START ON 12/08/2023] amphetamine-dextroamphetamine (ADDERALL) 30 MG tablet Take 1 tablet by mouth 3 (three) times daily. 90 tablet 0   [START ON 01/05/2024] amphetamine-dextroamphetamine (ADDERALL) 30 MG tablet Take 1 tablet by mouth 3 (three) times daily. 90 tablet 0   No current facility-administered medications for this visit.    Medication Side Effects: None  Allergies: No Known Allergies  Past Medical History:  Diagnosis Date   ADHD    Anxiety and depression    Hyperlipidemia    Vitamin D  insufficiency     Family History  Problem Relation Age of Onset   Heart failure Father    Diabetes Father    Diabetes Sister    Bipolar disorder Sister    Heart disease Brother    Hepatitis C Brother    Depression Brother     Social History   Socioeconomic History   Marital status: Divorced    Spouse name: Not on file   Number of children: 4   Years of education: Not on file   Highest education level: Professional school degree (e.g., MD, DDS, DVM, JD)  Occupational History   Occupation: physician; 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  Tobacco Use   Smoking status: Never   Smokeless tobacco: Never  Vaping Use   Vaping status: Never Used  Substance and Sexual Activity   Alcohol use: Yes    Alcohol/week: 7.0 standard drinks of alcohol    Types: 7 Shots of liquor per week    Comment: Occasional   Drug use: Never   Sexual activity: Not on file  Other Topics Concern   Not on file  Social History Narrative   Not on file   Social Drivers of Health   Financial Resource Strain: Not on file  Food Insecurity: Not on file  Transportation Needs: Not on file  Physical Activity: Not on file  Stress: Not on file  Social Connections: Not on file  Intimate Partner Violence: Not on file    Past Medical History, Surgical history, Social history, and Family history were reviewed and updated as appropriate.   Please see review of systems for further details on the patient's review from today.   Objective:   Physical Exam:  There were no vitals taken for this visit.  Physical Exam Constitutional:      General: Justin Marshall is not in acute distress. Musculoskeletal:        General: No deformity.  Neurological:     Mental Status: Justin Marshall is alert and oriented to person, place, and time.     Cranial Nerves: No dysarthria.     Coordination: Coordination normal.  Psychiatric:        Attention and Perception: Attention and perception normal. Justin Marshall does  not perceive auditory or visual hallucinations.        Mood  and Affect: Mood is anxious. Mood is not depressed. Affect is not labile.        Speech: Speech normal.        Behavior: Behavior normal. Behavior is cooperative.        Thought Content: Thought content normal. Thought content is not delusional. Thought content does not include homicidal or suicidal ideation.        Cognition and Memory: Cognition and memory normal.        Judgment: Judgment normal.     Comments: Insight good.   Chronic OCD sx incl cleaning and hoarding but dreads it and procrastinates.  Not worse.     Lab Review:     Component Value Date/Time   NA 139 11/15/2018 1235   K 4.2 11/15/2018 1235   CL 104 11/15/2018 1235   CO2 26 11/15/2018 1235   GLUCOSE 117 (H) 11/15/2018 1235   BUN 22 11/15/2018 1235   CREATININE 1.10 11/15/2018 1235   CALCIUM 9.4 11/15/2018 1235   GFRNONAA 69 11/15/2018 1235   GFRAA 80 11/15/2018 1235       Component Value Date/Time   WBC 6.2 09/30/2023 1226   WBC 7.4 11/15/2018 1235   RBC 4.13 (L) 09/30/2023 1226   RBC 4.21 11/15/2018 1235   HGB 13.7 09/30/2023 1226   HCT 41.5 09/30/2023 1226   PLT 278 09/30/2023 1226   MCV 101 (H) 09/30/2023 1226   MCH 33.2 (H) 09/30/2023 1226   MCH 30.9 11/15/2018 1235   MCHC 33.0 09/30/2023 1226   MCHC 33.0 11/15/2018 1235   RDW 12.9 09/30/2023 1226   LYMPHSABS 1.2 09/30/2023 1226   EOSABS 0.1 09/30/2023 1226   BASOSABS 0.0 09/30/2023 1226    No results found for: "POCLITH", "LITHIUM"   No results found for: "PHENYTOIN", "PHENOBARB", "VALPROATE", "CBMZ"   .res Assessment: Plan:    Justin Marshall was seen today for follow-up.  Diagnoses and all orders for this visit:  Mild recurrent major depression (HCC)  Generalized anxiety disorder  Attention deficit hyperactivity disorder (ADHD), combined type -     amphetamine-dextroamphetamine (ADDERALL) 30 MG tablet; Take 1 tablet by mouth 3 (three) times daily. -     amphetamine-dextroamphetamine (ADDERALL) 30 MG tablet; Take 1 tablet by mouth 3  (three) times daily. -     amphetamine-dextroamphetamine (ADDERALL) 30 MG tablet; Take 1 tablet by mouth 3 (three) times daily.  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  Justin Marshall has taken this medication in the past and has noticed benefit again this time..  Discussed side effects of SSRIs.  Disc normal dosage range.  No evidence for mania in recent years.  Cont to work on getting rid of clutter.  Justin Marshall's made some progress.  some chronic OCD tendencies.  But SSRIs not that great for hoarding.    For cost reasons Justin Marshall has stopped Vyvanse and switched to Adderall 30 mg 3 times daily.  Justin Marshall is aware this is a high dose but feels it is medically necessary.  His ADD has been severe and chronic and it is appropriate.  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    Justin Marshall is going  to follow-up with his cardiologist as a matter of routine discussed the risk of between dosage sedation if Justin Marshall is not careful to space them out.  Justin Marshall is aware.  Reduce caffeine and none after 3 PM.    Discussed potential benefits, risks, and side effects of stimulants with patient to include increased heart rate, palpitations, insomnia, increased anxiety, increased irritability, or decreased appetite.  Instructed patient to contact office if experiencing any significant tolerability issues.  Disc withdrawal if consistent.    Monitor for any sign of rash. Lamotrigine rash may be severe and life-threatening.  Contact office immediately rash develops. Recommend seeking urgent medical attention if rash is severe and/or spreading quickly. Continue lamotrigine for mood stabilty 100 mg   L-methylfolate was too expensive.  Justin Marshall could consider it again using good Rx but that is  not convenient to him.  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    No med changes:  Continue Adderall 30 mg 3 times daily for severe ADD, fluoxetine 20 mg daily, lamotrigine 100 mg daily  Justin Marshall plans to continue vitamin D.  Justin Marshall is continuing counseling with Dr. Laurell Roof. We discussed ADHD strategies.  Follow-up 6 months  Iona Hansen, MD, DFAPA   Please see After Visit Summary for patient specific instructions.  Future Appointments  Date Time Provider Department Center  11/19/2023  2:00 PM [facility] MR 3 [facility]  11/19/2023  2:40 PM [facility] MR 3 [facility]  11/19/2023  3:20 PM [facility]315 MR 3 [facility]     No orders of the defined types were placed in this encounter.      -------------------------------

## 2023-11-14 DIAGNOSIS — F5101 Primary insomnia: Secondary | ICD-10-CM | POA: Diagnosis not present

## 2023-11-14 DIAGNOSIS — G4733 Obstructive sleep apnea (adult) (pediatric): Secondary | ICD-10-CM | POA: Diagnosis not present

## 2023-11-16 ENCOUNTER — Encounter: Payer: Self-pay | Admitting: Diagnostic Neuroimaging

## 2023-11-19 ENCOUNTER — Ambulatory Visit
Admission: RE | Admit: 2023-11-19 | Discharge: 2023-11-19 | Disposition: A | Discharge status: Home / Self Care | Payer: Medicare HMO | Source: Ambulatory Visit | Attending: Diagnostic Neuroimaging | Admitting: Diagnostic Neuroimaging

## 2023-11-19 DIAGNOSIS — H532 Diplopia: Secondary | ICD-10-CM

## 2023-11-19 MED ORDER — GADOPICLENOL 0.5 MMOL/ML IV SOLN
7.0000 mL | Freq: Once | INTRAVENOUS | Status: AC | PRN
  Administered 2023-11-19: 7 mL via INTRAVENOUS

## 2023-11-24 ENCOUNTER — Encounter: Payer: Self-pay | Admitting: Diagnostic Neuroimaging

## 2023-11-28 ENCOUNTER — Encounter: Payer: Self-pay | Admitting: Diagnostic Neuroimaging

## 2024-01-12 DIAGNOSIS — G4733 Obstructive sleep apnea (adult) (pediatric): Secondary | ICD-10-CM | POA: Diagnosis not present

## 2024-01-12 DIAGNOSIS — F5101 Primary insomnia: Secondary | ICD-10-CM | POA: Diagnosis not present

## 2024-01-19 DIAGNOSIS — H353131 Nonexudative age-related macular degeneration, bilateral, early dry stage: Secondary | ICD-10-CM | POA: Diagnosis not present

## 2024-01-19 DIAGNOSIS — Z961 Presence of intraocular lens: Secondary | ICD-10-CM | POA: Diagnosis not present

## 2024-01-19 DIAGNOSIS — H43813 Vitreous degeneration, bilateral: Secondary | ICD-10-CM | POA: Diagnosis not present

## 2024-01-19 DIAGNOSIS — H40053 Ocular hypertension, bilateral: Secondary | ICD-10-CM | POA: Diagnosis not present

## 2024-01-19 DIAGNOSIS — H04123 Dry eye syndrome of bilateral lacrimal glands: Secondary | ICD-10-CM | POA: Diagnosis not present

## 2024-02-14 DIAGNOSIS — G4733 Obstructive sleep apnea (adult) (pediatric): Secondary | ICD-10-CM | POA: Diagnosis not present

## 2024-02-14 DIAGNOSIS — F5101 Primary insomnia: Secondary | ICD-10-CM | POA: Diagnosis not present

## 2024-02-20 ENCOUNTER — Other Ambulatory Visit: Payer: Self-pay

## 2024-02-20 ENCOUNTER — Telehealth: Payer: Self-pay | Admitting: Psychiatry

## 2024-02-20 DIAGNOSIS — F902 Attention-deficit hyperactivity disorder, combined type: Secondary | ICD-10-CM

## 2024-02-20 MED ORDER — AMPHETAMINE-DEXTROAMPHETAMINE 30 MG PO TABS: 30.0000 mg | ORAL_TABLET | Freq: Three times a day (TID) | ORAL | 0 refills | Status: DC

## 2024-02-20 MED ORDER — AMPHETAMINE-DEXTROAMPHETAMINE 30 MG PO TABS
30.0000 mg | ORAL_TABLET | Freq: Three times a day (TID) | ORAL | 0 refills | Status: DC
Start: 2024-04-16 — End: 2024-05-17

## 2024-02-20 NOTE — Telephone Encounter (Signed)
 Pt LVM @ 9:12a requesting refill of Adderall to  [id number] [place], [place] - [address] AT [facility] [address], [place] [address] Phone: [phone]  Fax: [phone]   Next appt 9/18

## 2024-02-20 NOTE — Telephone Encounter (Signed)
 Pended 3 RF for Adderall 30 mg, #90, to [facility] in [place].

## 2024-05-17 ENCOUNTER — Encounter: Payer: Self-pay | Admitting: Psychiatry

## 2024-05-17 ENCOUNTER — Ambulatory Visit: Admitting: Psychiatry

## 2024-05-17 DIAGNOSIS — F411 Generalized anxiety disorder: Secondary | ICD-10-CM | POA: Diagnosis not present

## 2024-05-17 DIAGNOSIS — F902 Attention-deficit hyperactivity disorder, combined type: Secondary | ICD-10-CM

## 2024-05-17 DIAGNOSIS — F330 Major depressive disorder, recurrent, mild: Secondary | ICD-10-CM

## 2024-05-17 MED ORDER — AMPHETAMINE-DEXTROAMPHETAMINE 30 MG PO TABS: 30.0000 mg | ORAL_TABLET | Freq: Three times a day (TID) | ORAL | 0 refills | Status: DC

## 2024-05-17 MED ORDER — AMPHETAMINE-DEXTROAMPHETAMINE 30 MG PO TABS: 30.0000 mg | ORAL_TABLET | Freq: Three times a day (TID) | ORAL | 0 refills | Status: AC

## 2024-05-17 NOTE — Progress Notes (Signed)
 Justin Marshall [id number] 20-Mar-1951 73 y.o.   Subjective:   Patient ID:  Justin Marshall is a 73 y.o. (DOB 1951/01/14) male.  Chief Complaint:  Chief Complaint  Patient presents with   Follow-up    HPI Justin Marshall presents for follow-up of their ADD, history of anxiety and history of depression.  At visit Jan 10, 2019.  We initiated a return to Lexapro  20 mg for some depressive and obsessive symptoms.  He was alternating between Vyvanse  and Adderall because of cost.  12/12/19 appt with following noted: Pretty good overall.  Good sleep.  L-methylfolate too expensive.  Iron level low.  Taking vitamin D usually. D got on his nerves decluttering him and throwing things away.     Gets distracted cleaning and straightening up.   Semi retired working 3 days a week. Feels better with less depression and less negative and less obsessive negative thinking.  His clinic closed bc of Covid affecting the clinic.  Still struggles getting things done bc overwhelmed with demands.  May be a little anemic and that causes some tiredness.  B12 is normal.   Riding bike.  Tend to be depressed more than he wants to be.  Can be depressed and some obsessiveness and can't let go of things.   Plan: For cost reasons he has stopped Vyvanse  and switched to Adderall 30 mg 3 times daily.   Continue Lexapro  10 and lithium  300 mg daily and lamotrigine  150. No med changes today.  06/30/2020 appointment with the following noted: No covid.  Vaccinated. Overall maintaining mood.  Lonely.  Youngest D PHD psychologist and just got married.  3rd D is still in school in PHD and causes some stress.  2nd D in [place] and has GD.  Oldest D NP with [facility].   Disc withdrawal with Adderall.  Still takes longer than he wants to do things.   Plan no med changes  12/15/2020 appointment with the following noted: Occ feels winded but cardiologist says he's OK.  Energy is variable from good to not so good.  Sleep seems  pretty good overall.  Admits he still has too much caffeine and often after 6 PM.  Would like to meet a woman. Asks about meds and tiredness. Overall mood and anxiety are OK.  Drags some in the morning wondering what to do with the day.  Good interest and enjoyment.   Plan: Continue lithium  300 mg daily.  Lexapro  10 mg daily and lamotrigine  100 mg daily. And Adderall 30 mg TID No med changes today except trial reduction in lamotrigine  to 100 mg daily to see if energy is better.  07/02/2021 appointment with the following noted: Mood has been OK.  Mostly same old issues.  Need to work on negative thought process.  Still looking for savior girl friend to rescue me but realizes it's not the right way to think. Hard to keep the house clean.  56 yo grand daughter said his kitchen was a mess.   Still chronically sleepy and tired. Usually sleep average 7 hours. No unusual mood swings.  Stays busy.  Patient reports stable mood and denies irritable moods.  No manic symptoms.  Patient denies any recent difficulty with anxiety.  Patient denies difficulty with sleep initiation or maintenance. 6-8 hours sleep.   Denies appetite disturbance.  Patient reports that  motivation have been good.  Patient still has problems with concentration.  Patient denies any suicidal ideation. Plan: no changes  10/05/21 appt noted:   More tired than he used to be.  Pending PE.  Usually chronically active.  Usually exercises.  A little worried.   Chronically scattered and difficulty getting things done.  Recognizes still hoarder.  Wonders if still depreessed but staff says he looks better.  Not hopeless or helpless.  Recognizes procrastination.   Drinks a lot of caffeine and has the Adderall.   Plans to continue cycling.    04/06/22 appt noted: Crashes if out of Adderall and dealing with shortage.  Missed some this week. Can function so much better with it. Worries about some chronic tiredness and some SOB.  Some worry. Notices  some differences with generic Adderall.  White ones less effective than orange ones. Going to [place] August and to visit's mother's home town. B may go too. Enjoys new riding Associate Professor.  Has 6 others. Mood has been ok.  No swings. Chronic procrastination.  Ongoing but some progress in areas. Drinks energy sodas at times.   Satisfied with meds.  09/23/2022 appointment noted: Psych meds: Adderall 30 mg 3 times daily for severe ADD, fluoxetine  20 mg daily, lamotrigine  100 mg daily No worse than usual.  Nothing spectacular since here.  Still working and looking for a soul mate.  Not very active socially.   No sig heart issues and health.  Good endurance. Mood good.  No unusual anxiety.  Sleep ok.   No SE.   Gets sleepy when 3rd Adderall wears off. Plan: No med changes:  Continue Adderall 30 mg 3 times daily for severe ADD, fluoxetine  20 mg daily, lamotrigine  100 mg daily  05/12/23 appt noted: D on Adderall.  [name] concerned she is paranoid.  Needs new doc. She withdrew from school.  Concerned she might be paranoid.  Dep at times bc lonely.  But not clinical.  Satisfied with meds.  Would like a relationship.  Still enjoys things and home.  Function ok except ADD long term.   Chronically scattered.  Still needs ADD meds.   No SE.  Health ok except ? Glaucoma. DM borderline.  Testosterone 350.   Anxiety about the same bc struggles with chronic lateness.  If not under schedule then not anxious.   Borderline OSA.  CPAP didn't help much.   No sig sodas and less coffee.  11/10/23 appt noted: Med:  Continue Adderall 30 mg 3 times daily for severe ADD, fluoxetine  20 mg daily, lamotrigine  100 mg daily Stress with paranoid D not getting help.  She's in his rental and he pays utilities.   Would like partner in life. On meds feels normal and don't fly off handle as much .   Chronic stress with ADD and not getting things done the way he'd like.  Starts projects he doesn't finish.   Still pleased  with meds.   Sleep ok except when he disrupts his own cycyle with caffeine.  Uses CPAP sporadically.  05/17/24 appt : Meds unchanged from above, but out of Prozac  for a couple of weeks. No sig change off Prozac .  Stays some dep over D 72 yo  who is a stress.  Living with him now. Failure to launch.  Not sure what she wants to do and not really looking for a job.  I'm enabling her.  She stays in bed too much.   Ex wife Justin Marshall Adderall is working fine. No SE Not over reacting to things like he has done in the past.  Still working some PT.  Still exercising some.  Still has a number of projects he wants to do that he's not currently doing.  Health is good but some fatigue.    Past Psychiatric Medication Trials:  Modafinil, Strattera tremor, Wellbutrin, Vyvanse , Ritalin,  Adzenys , Adderall 30 TID, Aricept off label without benefit,  lamotrigine ,  Depakote tremor,   Lexapro  10 Fluoxetine  20  Review of Systems:  Review of Systems  Constitutional:  Positive for fatigue.  Cardiovascular:  Negative for palpitations.  Neurological:  Negative for tremors.  Psychiatric/Behavioral:  Positive for decreased concentration, dysphoric mood and sleep disturbance.     Medications: I have reviewed the patient's current medications.  Current Outpatient Medications  Medication Sig Dispense Refill   atorvastatin  (LIPITOR) 10 MG tablet TAKE 1 TABLET BY MOUTH DAILY. PLEASE CALL OFFICE FOR FURTHER REFILLS 90 tablet 0   Cholecalciferol (VITAMIN D3) 1.25 MG (50000 UT) CAPS Take 50,000 Units by mouth once a week.     lamoTRIgine  (LAMICTAL ) 100 MG tablet TAKE 1 TABLET BY MOUTH EVERY DAY 90 tablet 3   Multiple Vitamin (MULTIVITAMIN) capsule Take 1 capsule by mouth daily.     naproxen (NAPROSYN) 500 MG tablet Take 1 tablet by mouth as needed.     Omega 3 1200 MG CAPS Take by mouth. 1-2 caps daily     amphetamine -dextroamphetamine  (ADDERALL) 30 MG tablet Take 1 tablet by mouth 3 (three) times daily. 90 tablet 0    [START ON 06/14/2024] amphetamine -dextroamphetamine  (ADDERALL) 30 MG tablet Take 1 tablet by mouth 3 (three) times daily. 90 tablet 0   [START ON 07/12/2024] amphetamine -dextroamphetamine  (ADDERALL) 30 MG tablet Take 1 tablet by mouth 3 (three) times daily. 90 tablet 0   FLUoxetine  (PROZAC ) 20 MG capsule TAKE 1 CAPSULE BY MOUTH EVERY DAY (Patient not taking: Reported on 05/17/2024) 90 capsule 1   No current facility-administered medications for this visit.    Medication Side Effects: None  Allergies: No Known Allergies  Past Medical History:  Diagnosis Date   ADHD    Anxiety and depression    Hyperlipidemia    Vitamin D insufficiency     Family History  Problem Relation Age of Onset   Heart failure Father    Diabetes Father    Diabetes Sister    Bipolar disorder Sister    Heart disease Brother    Hepatitis C Brother    Depression Brother     Social History   Socioeconomic History   Marital status: Divorced    Spouse name: Not on file   Number of children: 4   Years of education: Not on file   Highest education level: Professional school degree (e.g., MD, DDS, DVM, JD)  Occupational History   Occupation: physician; 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  Tobacco Use   Smoking status: Never   Smokeless tobacco: Never  Vaping Use   Vaping status: Never Used  Substance and Sexual Activity   Alcohol use: Yes    Alcohol/week: 7.0 standard drinks of alcohol    Types: 7 Shots of liquor per week    Comment: Occasional   Drug use: Never   Sexual activity: Not on file  Other Topics Concern   Not on file  Social History Narrative   Not on file   Social Drivers of Health   Financial Resource Strain: Not on file  Food Insecurity: Not on file  Transportation Needs: Not on file  Physical Activity: Not on file  Stress: Not on file  Social Connections: Not on file  Intimate Partner Violence:  Not on file    Past Medical History, Surgical history, Social history, and Family history were  reviewed and updated as appropriate.   Please see review of systems for further details on the patient's review from today.   Objective:   Physical Exam:  There were no vitals taken for this visit.  Physical Exam Constitutional:      General: He is not in acute distress. Musculoskeletal:        General: No deformity.  Neurological:     Mental Status: He is alert and oriented to person, place, and time.     Cranial Nerves: No dysarthria.     Coordination: Coordination normal.  Psychiatric:        Attention and Perception: Attention and perception normal. He does not perceive auditory or visual hallucinations.        Mood and Affect: Mood is anxious and depressed. Affect is not labile.        Speech: Speech normal.        Behavior: Behavior normal. Behavior is cooperative.        Thought Content: Thought content normal. Thought content is not delusional. Thought content does not include homicidal or suicidal ideation.        Cognition and Memory: Cognition and memory normal.        Judgment: Judgment normal.     Comments: Insight good.   Chronic OCD sx incl cleaning and hoarding but dreads it and procrastinates.  Not worse. Mild dysphoria.      Lab Review:     Component Value Date/Time   NA 139 11/15/2018 1235   K 4.2 11/15/2018 1235   CL 104 11/15/2018 1235   CO2 26 11/15/2018 1235   GLUCOSE 117 (H) 11/15/2018 1235   BUN 22 11/15/2018 1235   CREATININE 1.10 11/15/2018 1235   CALCIUM  9.4 11/15/2018 1235   GFRNONAA 69 11/15/2018 1235   GFRAA 80 11/15/2018 1235       Component Value Date/Time   WBC 6.2 09/30/2023 1226   WBC 7.4 11/15/2018 1235   RBC 4.13 (L) 09/30/2023 1226   RBC 4.21 11/15/2018 1235   HGB 13.7 09/30/2023 1226   HCT 41.5 09/30/2023 1226   PLT 278 09/30/2023 1226   MCV 101 (H) 09/30/2023 1226   MCH 33.2 (H) 09/30/2023 1226   MCH 30.9 11/15/2018 1235   MCHC 33.0 09/30/2023 1226   MCHC 33.0 11/15/2018 1235   RDW 12.9 09/30/2023 1226   LYMPHSABS  1.2 09/30/2023 1226   EOSABS 0.1 09/30/2023 1226   BASOSABS 0.0 09/30/2023 1226    No results found for: POCLITH, LITHIUM    No results found for: PHENYTOIN, PHENOBARB, VALPROATE, CBMZ   .res Assessment: Plan:    Winton was seen today for follow-up.  Diagnoses and all orders for this visit:  Attention deficit hyperactivity disorder (ADHD), combined type -     amphetamine -dextroamphetamine  (ADDERALL) 30 MG tablet; Take 1 tablet by mouth 3 (three) times daily. -     amphetamine -dextroamphetamine  (ADDERALL) 30 MG tablet; Take 1 tablet by mouth 3 (three) times daily. -     amphetamine -dextroamphetamine  (ADDERALL) 30 MG tablet; Take 1 tablet by mouth 3 (three) times daily.  Mild recurrent major depression (HCC)  Generalized anxiety disorder   30 min Option resume or hold fluoxetine  20 mg daily  [name] doing ok emotionally and sx were mild before the med.  It is reasonable to try off the med for awhile.  Disc SS of relapse that might occur  weeks later.  Can resume if he feels the need.   For cost reasons he has stopped Vyvanse  and switched to Adderall 30 mg 3 times daily.  He is aware this is a high dose but feels it is medically necessary.  His ADD has been severe and chronic and it is appropriate.  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    He is going to follow-up with his cardiologist as a matter of routine discussed the risk of between dosage sedation if he is not careful to space them out.  He is aware.  Reduce caffeine and none after 3 PM.    Discussed potential benefits, risks, and side effects of stimulants with patient to include increased heart rate, palpitations, insomnia, increased anxiety, increased irritability, or decreased appetite.  Instructed patient to contact office if experiencing any significant tolerability issues.  Disc withdrawal if consistent.    Monitor for any sign of rash. Lamotrigine  rash may be  severe and life-threatening.  Contact office immediately rash develops. Recommend seeking urgent medical attention if rash is severe and/or spreading quickly. Continue lamotrigine  for mood stabilty 100 mg   L-methylfolate was too expensive.  He could consider it again using good Rx but that is not convenient to him.   Continue Adderall 30 mg 3 times daily for severe ADD, hold fluoxetine  20 mg daily, lamotrigine  100 mg daily  He plans to continue vitamin D.  Follow-up 6 months  Lorene, MD, DFAPA   Please see After Visit Summary for patient specific instructions.  No future appointments.    No orders of the defined types were placed in this encounter.      -------------------------------

## 2024-08-02 DIAGNOSIS — H40053 Ocular hypertension, bilateral: Secondary | ICD-10-CM | POA: Diagnosis not present

## 2024-08-02 DIAGNOSIS — H43813 Vitreous degeneration, bilateral: Secondary | ICD-10-CM | POA: Diagnosis not present

## 2024-08-02 DIAGNOSIS — H04123 Dry eye syndrome of bilateral lacrimal glands: Secondary | ICD-10-CM | POA: Diagnosis not present

## 2024-08-02 DIAGNOSIS — H353131 Nonexudative age-related macular degeneration, bilateral, early dry stage: Secondary | ICD-10-CM | POA: Diagnosis not present

## 2024-08-02 DIAGNOSIS — Z961 Presence of intraocular lens: Secondary | ICD-10-CM | POA: Diagnosis not present

## 2024-08-20 ENCOUNTER — Telehealth: Payer: Self-pay | Admitting: Psychiatry

## 2024-08-20 ENCOUNTER — Other Ambulatory Visit: Payer: Self-pay

## 2024-08-20 DIAGNOSIS — F902 Attention-deficit hyperactivity disorder, combined type: Secondary | ICD-10-CM

## 2024-08-20 NOTE — Telephone Encounter (Signed)
 Pt called req 90 day of Adderall and rf of Fluoxetine     Appt 3/19

## 2024-08-20 NOTE — Telephone Encounter (Signed)
 What pharmacy? Usually uses [facility] in [place].  Last note said to hold fluoxetine . LVM to RC.

## 2024-08-21 ENCOUNTER — Other Ambulatory Visit: Payer: Self-pay | Admitting: Psychiatry

## 2024-08-21 DIAGNOSIS — F902 Attention-deficit hyperactivity disorder, combined type: Secondary | ICD-10-CM

## 2024-08-21 DIAGNOSIS — F422 Mixed obsessional thoughts and acts: Secondary | ICD-10-CM

## 2024-08-21 DIAGNOSIS — F330 Major depressive disorder, recurrent, mild: Secondary | ICD-10-CM

## 2024-08-21 DIAGNOSIS — F423 Hoarding disorder: Secondary | ICD-10-CM

## 2024-08-21 MED ORDER — AMPHETAMINE-DEXTROAMPHETAMINE 30 MG PO TABS: 30.0000 mg | ORAL_TABLET | Freq: Three times a day (TID) | ORAL | 0 refills | Status: DC

## 2024-08-21 MED ORDER — FLUOXETINE HCL 20 MG PO CAPS: 20.0000 mg | ORAL_CAPSULE | Freq: Every day | ORAL | 1 refills | Status: AC

## 2024-08-21 NOTE — Telephone Encounter (Signed)
 Pt asked for RF of fluoxetine . Last note says to hold it. He said you told him if he needed to restart it to let you know. He said he feels like he does need to restart it. Will send Rx as appropriate.   [facility] [place].

## 2024-08-21 NOTE — Telephone Encounter (Signed)
Pended Adderall 

## 2024-09-21 ENCOUNTER — Telehealth: Payer: Self-pay | Admitting: Psychiatry

## 2024-09-21 ENCOUNTER — Other Ambulatory Visit: Payer: Self-pay

## 2024-09-21 DIAGNOSIS — F902 Attention-deficit hyperactivity disorder, combined type: Secondary | ICD-10-CM

## 2024-09-21 MED ORDER — AMPHETAMINE-DEXTROAMPHETAMINE 30 MG PO TABS: 30.0000 mg | ORAL_TABLET | Freq: Three times a day (TID) | ORAL | 0 refills | Status: AC

## 2024-09-21 NOTE — Telephone Encounter (Signed)
Pended 2 RF.

## 2024-09-21 NOTE — Telephone Encounter (Signed)
 Pt requesting Rx for Adderall 30 mg 3/d #90 to [facility].  Apt 3/19

## 2024-11-15 ENCOUNTER — Ambulatory Visit (INDEPENDENT_AMBULATORY_CARE_PROVIDER_SITE_OTHER): Admitting: Psychiatry
